# Patient Record
Sex: Male | Born: 1955 | Race: White | Hispanic: No | Marital: Married | Smoking: Former smoker
Health system: Southern US, Community
[De-identification: ages and names within clinical notes are randomized; demographics above are authoritative.]

## PROBLEM LIST (undated history)

## (undated) DIAGNOSIS — K219 Gastro-esophageal reflux disease without esophagitis: Secondary | ICD-10-CM

## (undated) DIAGNOSIS — I10 Essential (primary) hypertension: Secondary | ICD-10-CM

## (undated) DIAGNOSIS — I447 Left bundle-branch block, unspecified: Secondary | ICD-10-CM

## (undated) HISTORY — DX: Essential (primary) hypertension: I10

## (undated) HISTORY — PX: SHOULDER SURGERY: SHX246

---

## 1996-01-29 HISTORY — PX: CARDIAC CATHETERIZATION: SHX172

## 1998-01-28 HISTORY — PX: KNEE ARTHROSCOPY: SUR90

## 2006-01-28 HISTORY — PX: COLONOSCOPY: SHX174

## 2006-09-02 ENCOUNTER — Encounter: Payer: Self-pay | Admitting: Gastroenterology

## 2006-09-02 ENCOUNTER — Ambulatory Visit: Payer: Self-pay | Admitting: Gastroenterology

## 2010-04-02 ENCOUNTER — Encounter (INDEPENDENT_AMBULATORY_CARE_PROVIDER_SITE_OTHER): Payer: Self-pay | Admitting: *Deleted

## 2010-04-04 ENCOUNTER — Encounter: Payer: Self-pay | Admitting: Gastroenterology

## 2010-04-10 ENCOUNTER — Telehealth: Payer: Self-pay | Admitting: Gastroenterology

## 2010-04-10 NOTE — Letter (Signed)
Summary: Moviprep Instructions  [facility]  [address]   [place], [place] [address]   Phone: [phone]  Fax: [phone]       Ryan Thomas    1955-02-12    MRN: [id number]        Procedure Day Dorna Bloom: Wednesday, 04-18-10     Arrival Time: 1:30 p.m.     Procedure Time: 2:30 p.m.     Location of Procedure:                    x [facility] (4th Floor)                        PREPARATION FOR COLONOSCOPY WITH MOVIPREP   Starting 5 days prior to your procedure 04-13-10 do not eat nuts, seeds, popcorn, corn, beans, peas,  salads, or any raw vegetables.  Do not take any fiber supplements (e.g. Metamucil, Citrucel, and Benefiber).  THE DAY BEFORE YOUR PROCEDURE         DATE: 04-17-10  DAY: Tuesday  1.  Drink clear liquids the entire day-NO SOLID FOOD  2.  Do not drink anything colored red or purple.  Avoid juices with pulp.  No orange juice.  3.  Drink at least 64 oz. (8 glasses) of fluid/clear liquids during the day to prevent dehydration and help the prep work efficiently.  CLEAR LIQUIDS INCLUDE: Water Jello Ice Popsicles Tea (sugar ok, no milk/cream) Powdered fruit flavored drinks Coffee (sugar ok, no milk/cream) Gatorade Juice: apple, white grape, white cranberry  Lemonade Clear bullion, consomm, broth Carbonated beverages (any kind) Strained chicken noodle soup Hard Candy                             4.  In the morning, mix first dose of MoviPrep solution:    Empty 1 Pouch A and 1 Pouch B into the disposable container    Add lukewarm drinking water to the top line of the container. Mix to dissolve    Refrigerate (mixed solution should be used within 24 hrs)  5.  Begin drinking the prep at 5:00 p.m. The MoviPrep container is divided by 4 marks.   Every 15 minutes drink the solution down to the next mark (approximately 8 oz) until the full liter is complete.   6.  Follow completed prep with 16 oz of clear liquid of your choice  (Nothing red or purple).  Continue to drink clear liquids until bedtime.  7.  Before going to bed, mix second dose of MoviPrep solution:    Empty 1 Pouch A and 1 Pouch B into the disposable container    Add lukewarm drinking water to the top line of the container. Mix to dissolve    Refrigerate  THE DAY OF YOUR PROCEDURE      DATE: 04-18-10 DAY: Wednesday  Beginning at 9:30 a.m. (5 hours before procedure):         1. Every 15 minutes, drink the solution down to the next mark (approx 8 oz) until the full liter is complete.  2. Follow completed prep with 16 oz. of clear liquid of your choice.    3. You may drink clear liquids until 12:30 p.m. (2 HOURS BEFORE PROCEDURE).   MEDICATION INSTRUCTIONS  Unless otherwise instructed, you should take regular prescription medications with a small sip of water   as early as possible the morning of your procedure.  OTHER INSTRUCTIONS  You will need a responsible adult at least 56 years of age to accompany you and drive you home.   This person must remain in the waiting room during your procedure.  Wear loose fitting clothing that is easily removed.  Leave jewelry and other valuables at home.  However, you may wish to bring a book to read or  an iPod/MP3 player to listen to music as you wait for your procedure to start.  Remove all body piercing jewelry and leave at home.  Total time from sign-in until discharge is approximately 2-3 hours.  You should go home directly after your procedure and rest.  You can resume normal activities the  day after your procedure.  The day of your procedure you should not:   Drive   Make legal decisions   Operate machinery   Drink alcohol   Return to work  You will receive specific instructions about eating, activities and medications before you leave.    The above instructions have been reviewed and explained to me by   Wyona Almas RN  April 04, 2010 9:21 AM     I fully  understand and can verbalize these instructions _____________________________ Date _________

## 2010-04-10 NOTE — Letter (Signed)
Summary: Pre Visit Letter Revised  [facility]  [address] [place], [place] [address]   Phone: [phone]  Fax: [phone]        04/02/2010 MRN: [id number] Ryan Thomas [address] [place], [place]  [address]             Procedure Date: 04/18/2010 @ 2:30   Direct colon-Dr. Jarold Motto   Welcome to the Gastroenterology Division at [facility].    You are scheduled to see a nurse for your pre-procedure visit on 04/04/2010 at 9:00 on the 3rd floor at [facility], [address]  We ask that you try to arrive at our office 15 minutes prior to your appointment time to allow for check-in.  Please take a minute to review the attached form.  If you answer "Yes" to one or more of the questions on the first page, we ask that you call the person listed at your earliest opportunity.  If you answer "No" to all of the questions, please complete the rest of the form and bring it to your appointment.    Your nurse visit will consist of discussing your medical and surgical history, your immediate family medical history, and your medications.   If you are unable to list all of your medications on the form, please bring the medication bottles to your appointment and we will list them.  We will need to be aware of both prescribed and over the counter drugs.  We will need to know exact dosage information as well.    Please be prepared to read and sign documents such as consent forms, a financial agreement, and acknowledgement forms.  If necessary, and with your consent, a friend or relative is welcome to sit-in on the nurse visit with you.  Please bring your insurance card so that we may make a copy of it.  If your insurance requires a referral to see a specialist, please bring your referral form from your primary care physician.  No co-pay is required for this nurse visit.     If you cannot keep your appointment, please call [phone] to cancel or reschedule prior to your  appointment date.  This allows [place] the opportunity to schedule an appointment for another patient in need of care.    Thank you for choosing [facility] for your medical needs.  We appreciate the opportunity to care for you.  Please visit [place] at our website  to learn more about our practice.  Sincerely, The Gastroenterology Division

## 2010-04-10 NOTE — Miscellaneous (Signed)
Summary: [facility] Previsit/prep  Clinical Lists Changes  Medications: Added new medication of MOVIPREP 100 GM  SOLR (PEG-KCL-NACL-NASULF-NA ASC-C) As per prep instructions. - Signed Rx of MOVIPREP 100 GM  SOLR (PEG-KCL-NACL-NASULF-NA ASC-C) As per prep instructions.;  #1 x 0;  Signed;  Entered by: Wyona Almas RN;  Authorized by: Mardella Layman MD [facility];  Method used: Electronically to [facility] [address], [place], [place], [place]  [address], Ph: [phone], Fax: [phone] Observations: Added new observation of NKA: T (04/04/2010 8:41)    Prescriptions: MOVIPREP 100 GM  SOLR (PEG-KCL-NACL-NASULF-NA ASC-C) As per prep instructions.  #1 x 0   Entered by:   Wyona Almas RN   Authorized by:   Mardella Layman MD [facility]   Signed by:   Wyona Almas RN on 04/04/2010   Method used:   Electronically to        [facility]  [address] (retail)       [address]       [place], [place]  [address]       Ph: [phone]       Fax: [phone]   RxID:   [id number]

## 2010-04-17 NOTE — Procedures (Signed)
Summary: Laural Roes MD  Laural Roes MD   Imported By: Lester [place] 04/09/2010 08:42:21  _____________________________________________________________________  External Attachment:    Type:   Image     Comment:   External Document

## 2010-04-18 ENCOUNTER — Other Ambulatory Visit: Payer: Self-pay | Admitting: Gastroenterology

## 2010-04-26 NOTE — Progress Notes (Signed)
Summary: Questions  Phone Note Call from Patient Call back at [phone]   Caller: Patient Call For: Dr. Jarold Motto Reason for Call: Talk to Nurse Summary of Call: Patient would like to speak with Dr. Maris Berger nurse about whether or not Dr. Jarold Motto thinks that he should have a colon at this time, his insurnace ([employer]) will not pay for a colon now because it has only been 3 years since his last but he wants the doctors opinion Initial call taken by: [place] Johnson,  April 10, 2010 11:48 AM  Follow-up for Phone Call        insurance will not pay for colonoscopy until 5 years which will be 2013, no exceptions. Do you want pt to have colonoscopy anyway. Follow-up by: Harlow Mares CMA Duncan Dull),  April 10, 2010 11:59 AM  Additional Follow-up for Phone Call Additional follow up Details #1::        5y is fine Additional Follow-up by: Mardella Layman MD FACG,  April 16, 2010 8:36 AM    Additional Follow-up for Phone Call Additional follow up Details #2::    pt advied and procedure cx. recall will be entered into Epic.

## 2011-09-20 ENCOUNTER — Encounter: Payer: Self-pay | Admitting: Gastroenterology

## 2011-09-27 ENCOUNTER — Ambulatory Visit (AMBULATORY_SURGERY_CENTER): Payer: BC Managed Care – PPO | Admitting: *Deleted

## 2011-09-27 ENCOUNTER — Encounter: Payer: Self-pay | Admitting: Gastroenterology

## 2011-09-27 VITALS — Ht 73.0 in | Wt 254.1 lb

## 2011-09-27 DIAGNOSIS — Z1211 Encounter for screening for malignant neoplasm of colon: Secondary | ICD-10-CM

## 2011-09-27 MED ORDER — PEG-KCL-NACL-NASULF-NA ASC-C 100 G PO SOLR: ORAL | Status: DC

## 2011-09-27 NOTE — Progress Notes (Signed)
No allergies to soy or products.  Pt brought his records from his 2008 with him.  Pt told not to take his Lisinopril-HCTZ the day of his procedure before coming in; taken when you get home that day.  Understanding voiced

## 2011-10-14 ENCOUNTER — Encounter: Payer: PRIVATE HEALTH INSURANCE | Admitting: Gastroenterology

## 2011-10-21 ENCOUNTER — Telehealth: Payer: Self-pay | Admitting: Gastroenterology

## 2011-10-21 ENCOUNTER — Encounter: Payer: Self-pay | Admitting: Gastroenterology

## 2011-10-21 NOTE — Telephone Encounter (Signed)
no

## 2011-10-23 ENCOUNTER — Encounter: Payer: BC Managed Care – PPO | Admitting: Gastroenterology

## 2012-01-29 HISTORY — PX: BLEPHAROPLASTY: SUR158

## 2012-01-29 HISTORY — PX: CATARACT EXTRACTION W/ INTRAOCULAR LENS  IMPLANT, BILATERAL: SHX1307

## 2012-10-06 ENCOUNTER — Ambulatory Visit: Payer: Self-pay | Admitting: Otolaryngology

## 2012-10-15 ENCOUNTER — Ambulatory Visit: Payer: Self-pay | Admitting: Otolaryngology

## 2012-11-16 ENCOUNTER — Ambulatory Visit: Payer: Self-pay | Admitting: Ophthalmology

## 2012-11-16 LAB — POTASSIUM: Potassium: 4 mmol/L (ref 3.5–5.1)

## 2012-11-24 ENCOUNTER — Ambulatory Visit: Payer: Self-pay | Admitting: Ophthalmology

## 2012-12-08 ENCOUNTER — Ambulatory Visit: Payer: Self-pay | Admitting: Ophthalmology

## 2012-12-08 LAB — POTASSIUM: Potassium: 4 mmol/L (ref 3.5–5.1)

## 2012-12-15 ENCOUNTER — Ambulatory Visit: Payer: Self-pay | Admitting: Ophthalmology

## 2013-01-19 ENCOUNTER — Ambulatory Visit: Payer: Self-pay | Admitting: Family Medicine

## 2013-11-05 ENCOUNTER — Telehealth: Payer: Self-pay | Admitting: Internal Medicine

## 2013-11-11 ENCOUNTER — Encounter: Payer: Self-pay | Admitting: Internal Medicine

## 2013-11-11 NOTE — Telephone Encounter (Signed)
Pt scheduled for appt.

## 2013-12-30 ENCOUNTER — Ambulatory Visit (AMBULATORY_SURGERY_CENTER): Payer: Self-pay | Admitting: *Deleted

## 2013-12-30 VITALS — Ht 73.0 in | Wt 273.0 lb

## 2013-12-30 DIAGNOSIS — Z86010 Personal history of colonic polyps: Secondary | ICD-10-CM

## 2013-12-30 MED ORDER — MOVIPREP 100 G PO SOLR: ORAL | Status: DC

## 2013-12-30 NOTE — Progress Notes (Signed)
No allergies to eggs or soy. No problems with anesthesia.  Pt given Emmi instructions for colonoscopy  No oxygen use  No diet drug use  

## 2014-01-12 ENCOUNTER — Ambulatory Visit (AMBULATORY_SURGERY_CENTER): Payer: BC Managed Care – PPO | Admitting: Internal Medicine

## 2014-01-12 ENCOUNTER — Encounter: Payer: Self-pay | Admitting: Internal Medicine

## 2014-01-12 VITALS — BP 134/81 | HR 61 | Temp 96.1°F | Resp 25 | Ht 73.0 in | Wt 273.0 lb

## 2014-01-12 DIAGNOSIS — D125 Benign neoplasm of sigmoid colon: Secondary | ICD-10-CM

## 2014-01-12 DIAGNOSIS — Z1211 Encounter for screening for malignant neoplasm of colon: Secondary | ICD-10-CM

## 2014-01-12 DIAGNOSIS — K621 Rectal polyp: Secondary | ICD-10-CM

## 2014-01-12 DIAGNOSIS — D123 Benign neoplasm of transverse colon: Secondary | ICD-10-CM

## 2014-01-12 DIAGNOSIS — D128 Benign neoplasm of rectum: Secondary | ICD-10-CM

## 2014-01-12 DIAGNOSIS — Z86010 Personal history of colonic polyps: Secondary | ICD-10-CM

## 2014-01-12 MED ORDER — SODIUM CHLORIDE 0.9 % IV SOLN: 500.0000 mL | INTRAVENOUS | Status: DC

## 2014-01-12 NOTE — Op Note (Signed)
[facility]  [address] [place], [address]   COLONOSCOPY PROCEDURE REPORT  PATIENT: Ryan, Thomas  MR#: [id number] BIRTHDATE: Jul 12, 1955 , 64  yrs. old GENDER: male ENDOSCOPIST: Jerene Bears, MD PROCEDURE DATE:  01/12/2014 PROCEDURE:   Colonoscopy with snare polypectomy First Screening Colonoscopy - Avg.  risk and is 50 yrs.  old or older - No.  Prior Negative Screening - Now for repeat screening. N/A  History of Adenoma - Now for follow-up colonoscopy & has been > or = to 3 yrs.  Yes hx of adenoma.  Has been 3 or more years since last colonoscopy.  Polyps Removed Today? Yes. ASA CLASS:   Class II INDICATIONS:surveillance colonoscopy based on a history of adenomatous colonic polyp(s). MEDICATIONS: Monitored anesthesia care, Propofol 450 mg IV, and lidocaine 40 mg IV  DESCRIPTION OF PROCEDURE:   After the risks benefits and alternatives of the procedure were thoroughly explained, informed consent was obtained.  The digital rectal exam revealed no rectal mass.   The [id number]  endoscope was introduced through the anus and advanced to the cecum, which was identified by both the appendix and ileocecal valve. No adverse events experienced. The quality of the prep was good, using MoviPrep  The instrument was then slowly withdrawn as the colon was fully examined.   COLON FINDINGS: Three sessile polyps ranging between 3-106mm in size were found in the transverse colon (2) and rectum (1). Polypectomies were performed with a cold snare.  The resection was complete, the polyp tissue was completely retrieved and sent to histology.   There was mild diverticulosis noted in the sigmoid colon.  Retroflexed views revealed no abnormalities. The time to cecum=4 minutes 04 seconds.  Withdrawal time=13 minutes 11 seconds. The scope was withdrawn and the procedure completed. COMPLICATIONS: There were no immediate complications.  ENDOSCOPIC IMPRESSION: 1.   Three  sessile polyps ranging between 3-42mm in size were found in the transverse colon and rectum; polypectomies were performed with a cold snare 2.   Mild diverticulosis was noted in the sigmoid colon  RECOMMENDATIONS: 1.  Await pathology results 2.  High fiber diet 3.  Timing of repeat colonoscopy will be determined by pathology findings. 4.  You will receive a letter within 1-2 weeks with the results of your biopsy as well as final recommendations.  Please call my office if you have not received a letter after 3 weeks.  eSigned:  Jerene Bears, MD 01/12/2014 8:31 AM   cc: [place] Pink, MD and The Patient

## 2014-01-12 NOTE — Progress Notes (Signed)
Stable to RR 

## 2014-01-12 NOTE — Patient Instructions (Signed)
Colon polyps removed today, diverticulosis seen also. Handouts given on diverticulosis, polyps and high fiber diet.  Resume current medications. Call us with any questions or concerns. Thank you!  YOU HAD AN ENDOSCOPIC PROCEDURE TODAY AT [facility]: Refer to the procedure report that was given to you for any specific questions about what was found during the examination.  If the procedure report does not answer your questions, please call your gastroenterologist to clarify.  If you requested that your care partner not be given the details of your procedure findings, then the procedure report has been included in a sealed envelope for you to review at your convenience later.  YOU SHOULD EXPECT: Some feelings of bloating in the abdomen. Passage of more gas than usual.  Walking can help get rid of the air that was put into your GI tract during the procedure and reduce the bloating. If you had a lower endoscopy (such as a colonoscopy or flexible sigmoidoscopy) you may notice spotting of blood in your stool or on the toilet paper. If you underwent a bowel prep for your procedure, then you may not have a normal bowel movement for a few days.  DIET: Your first meal following the procedure should be a light meal and then it is ok to progress to your normal diet.  A half-sandwich or bowl of soup is an example of a good first meal.  Heavy or fried foods are harder to digest and may make you feel nauseous or bloated.  Likewise meals heavy in dairy and vegetables can cause extra gas to form and this can also increase the bloating.  Drink plenty of fluids but you should avoid alcoholic beverages for 24 hours.  ACTIVITY: Your care partner should take you home directly after the procedure.  You should plan to take it easy, moving slowly for the rest of the day.  You can resume normal activity the day after the procedure however you should NOT DRIVE or use heavy machinery for 24 hours (because of the  sedation medicines used during the test).    SYMPTOMS TO REPORT IMMEDIATELY: A gastroenterologist can be reached at any hour.  During normal business hours, 8:30 AM to 5:00 PM Monday through Friday, call [phone].  After hours and on weekends, please call the GI answering service at [phone] who will take a message and have the physician on call contact you.   Following lower endoscopy (colonoscopy or flexible sigmoidoscopy):  Excessive amounts of blood in the stool  Significant tenderness or worsening of abdominal pains  Swelling of the abdomen that is new, acute  Fever of 100F or higher  Following upper endoscopy (EGD)  Vomiting of blood or coffee ground material  New chest pain or pain under the shoulder blades  Painful or persistently difficult swallowing  New shortness of breath  Fever of 100F or higher  Black, tarry-looking stools  FOLLOW UP: If any biopsies were taken you will be contacted by phone or by letter within the next 1-3 weeks.  Call your gastroenterologist if you have not heard about the biopsies in 3 weeks.  Our staff will call the home number listed on your records the next business day following your procedure to check on you and address any questions or concerns that you may have at that time regarding the information given to you following your procedure. This is a courtesy call and so if there is no answer at the home number and we have  not heard from you through the emergency physician on call, we will assume that you have returned to your regular daily activities without incident.  SIGNATURES/CONFIDENTIALITY: You and/or your care partner have signed paperwork which will be entered into your electronic medical record.  These signatures attest to the fact that that the information above on your After Visit Summary has been reviewed and is understood.  Full responsibility of the confidentiality of this discharge information lies with you and/or your  care-partner.

## 2014-01-12 NOTE — Progress Notes (Signed)
Called to room to assist during endoscopic procedure.  Patient ID and intended procedure confirmed with present staff. Received instructions for my participation in the procedure from the performing physician.  

## 2014-01-13 ENCOUNTER — Telehealth: Payer: Self-pay | Admitting: *Deleted

## 2014-01-13 NOTE — Telephone Encounter (Signed)
  Follow up Call-  Call back number 01/12/2014  Post procedure Call Back phone  # [phone]  Permission to leave phone message Yes   [facility]

## 2014-01-18 ENCOUNTER — Encounter: Payer: Self-pay | Admitting: Internal Medicine

## 2014-02-25 ENCOUNTER — Ambulatory Visit: Payer: Self-pay | Admitting: Orthopaedic Surgery

## 2014-04-10 ENCOUNTER — Emergency Department: Payer: Self-pay | Admitting: Emergency Medicine

## 2014-05-20 NOTE — Op Note (Signed)
PATIENT NAME:  Ryan Thomas, Ryan Thomas MR#:  [id number] DATE OF BIRTH:  25-Oct-1955  DATE OF PROCEDURE:  11/24/2012  LOCATION: [facility]  PREOPERATIVE DIAGNOSIS: Visually significant cataract of the right eye.   POSTOPERATIVE DIAGNOSIS: Visually significant cataract of the right eye.   OPERATIVE PROCEDURE: Cataract extraction by phacoemulsification with implant of intraocular lens to right eye.   SURGEON: Birder Robson, MD  ANESTHESIA:  1. Managed anesthesia care.  2. Topical tetracaine drops followed by 2% Xylocaine jelly applied in the preoperative holding area.   COMPLICATIONS: None.   TECHNIQUE:  Stop and chop.  DESCRIPTION OF PROCEDURE: The patient was examined and consented in the preoperative holding area where the aforementioned topical anesthesia was applied to the right eye and then brought back to the Operating Room where the right eye was prepped and draped in the usual sterile ophthalmic fashion and a lid speculum was placed. A paracentesis was created with the side port blade and the anterior chamber was filled with viscoelastic. A near clear corneal incision was performed with the steel keratome. A continuous curvilinear capsulorrhexis was performed with a cystotome followed by the capsulorrhexis forceps. Hydrodissection and hydrodelineation were carried out with BSS on a blunt cannula. The lens was removed in a stop and chop technique and the remaining cortical material was removed with the irrigation-aspiration handpiece. The capsular bag was inflated with viscoelastic and the Alcon [id number] 18.0-diopter lens, serial number [id number] was placed in the capsular bag without complication. The remaining viscoelastic was removed from the eye with the irrigation-aspiration handpiece. The wounds were hydrated. The anterior chamber was flushed with Miostat and the eye was inflated to physiologic pressure. 0.1 mL of cefuroxime concentration 10 mg/mL was placed in the  anterior chamber. The wounds were found to be water tight. The eye was dressed with Vigamox. The patient was given protective glasses to wear throughout the day and a shield with which to sleep tonight. The patient was also given drops with which to begin a drop regimen today and will follow-up with me in one day.     ____________________________ Livingston Diones. Shanetta Nicolls, MD wlp:mr D: 11/24/2012 22:02:54 ET T: 11/24/2012 19:21:13 ET JOB#: [id number]  cc: Eshawn Coor L. Baylen Buckner, MD, <Dictator> Livingston Diones Trygve Thal MD ELECTRONICALLY SIGNED 11/30/2012 10:30

## 2014-05-20 NOTE — Op Note (Signed)
PATIENT NAME:  Ryan Thomas, Ryan Thomas MR#:  [id number] DATE OF BIRTH:  [birth year]  DATE OF PROCEDURE:  12/15/2012  PREOPERATIVE DIAGNOSIS: Visually significant cataract of the left eye.   POSTOPERATIVE DIAGNOSIS: Visually significant cataract of the left eye.   OPERATIVE PROCEDURE: Cataract extraction by phacoemulsification with implant of intraocular lens to the left eye.   SURGEON: Birder Robson, MD.   ANESTHESIA:  1. Managed anesthesia care.  2. Topical tetracaine drops followed by 2% Xylocaine jelly applied in the preoperative holding area.   COMPLICATIONS: None.   TECHNIQUE:  Stop and chop.   DESCRIPTION OF PROCEDURE: The patient was examined and consented in the preoperative holding area where the aforementioned topical anesthesia was applied to the left eye and then brought back to the Operating Room where the left eye was prepped and draped in the usual sterile ophthalmic fashion and a lid speculum was placed. A paracentesis was created with the side port blade and the anterior chamber was filled with viscoelastic. A near clear corneal incision was performed with the steel keratome. A continuous curvilinear capsulorrhexis was performed with a cystotome followed by the capsulorrhexis forceps. Hydrodissection and hydrodelineation were carried out with BSS on a blunt cannula. The lens was removed in a stop and chop technique and the remaining cortical material was removed with the irrigation-aspiration handpiece. The capsular bag was inflated with viscoelastic and the Alcon Restore [id number] 18.0-diopter lens, serial number [id number] was placed in the capsular bag without complication. The remaining viscoelastic was removed from the eye with the irrigation-aspiration handpiece. The wounds were hydrated. The anterior chamber was flushed with Miostat and the eye was inflated to physiologic pressure. 0.1 mL of cefuroxime concentration 10 mg/mL was placed in the anterior chamber. The wounds were found  to be water tight. The eye was dressed with Vigamox. The patient was given protective glasses to wear throughout the day and a shield with which to sleep tonight. The patient was also given drops with which to begin a drop regimen today and will follow-up with me in one day.   ____________________________ Livingston Diones. Hema Lanza, MD wlp:gb D: 12/15/2012 20:45:37 ET T: 12/15/2012 21:30:55 ET JOB#: [id number]  cc: Anner Baity L. Shaquoya Cosper, MD, <Dictator> Livingston Diones Broly Hatfield MD ELECTRONICALLY SIGNED 12/16/2012 13:28

## 2014-05-20 NOTE — Op Note (Signed)
PATIENT NAME:  Ryan Thomas, Ryan Thomas MR#:  [id number] DATE OF BIRTH:  Jun 25, 1955  DATE OF PROCEDURE:  10/15/2012  PREOPERATIVE DIAGNOSIS: Bilateral upper eyelid blepharoptosis.   POSTOPERATIVE DIAGNOSIS: Bilateral upper eyelid blepharoptosis.   PROCEDRUE: Bilateral ptosis repair (Fasanella-Servat technique).   SURGEON: Nadeen Landau, M.D.   DESCRIPTION OF THE PROCEURE: The patient was placed in the supine position on the operating room table after general LMA anesthesia had been induced.  The patient was turned 90 degrees counterclockwise from anesthesia. The bilateral eyes were anesthetized with topical proparacaine. The eyelid was inverted over a Desmarres retractor and local anesthesia was injected. The cornea was protected with the corneal shield.  A Putterman clamp was used to clamp the conjunctiva and Mueller's muscle.  A double-arm 5-0 fast absorbing gut on an S14 needle was used to throw one line of sutures. The Mueller's muscle and conjunctiva was resected with a 15 blade in the Putterman clamp.  The second row of sutures was then placed. The knot was tied laterally. The corneal shield was removed. The eye was irrigated with BSS.  The patient was returned to anesthesia and allowed to emerge from anesthesia in the operating room, and taken to the recovery room in stable condition. Iced gauze was placed immediately on the eyes following the procedure.  Total amount of local used was 1.5 mL.  Total amount resected was  8 to 9 mm on the right and 8 mm on the left.  ADDENDUM: Due to creation of excess upper eyelid skin with the ptosis repair, conservative excision in the supratarsal crease externally was carried out and closed with 7-0 nylon on each side. No complications. Estimated blood loss 5 mL.   ____________________________ J. Nadeen Landau, MD jmc:aw D: 10/15/2012 10:19:41 ET T: 10/15/2012 10:28:44 ET JOB#: [id number]  cc: Janalee Dane, MD, <Dictator> Nicholos Johns MD ELECTRONICALLY  SIGNED 10/23/2012 9:59

## 2015-07-02 ENCOUNTER — Emergency Department (HOSPITAL_COMMUNITY)
Admission: EM | Admit: 2015-07-02 | Discharge: 2015-07-02 | Disposition: A | Discharge status: Home / Self Care | Payer: 59 | Attending: Emergency Medicine | Admitting: Emergency Medicine

## 2015-07-02 ENCOUNTER — Encounter (HOSPITAL_COMMUNITY): Payer: Self-pay

## 2015-07-02 DIAGNOSIS — F17210 Nicotine dependence, cigarettes, uncomplicated: Secondary | ICD-10-CM | POA: Insufficient documentation

## 2015-07-02 DIAGNOSIS — R079 Chest pain, unspecified: Secondary | ICD-10-CM | POA: Insufficient documentation

## 2015-07-02 DIAGNOSIS — R5383 Other fatigue: Secondary | ICD-10-CM | POA: Insufficient documentation

## 2015-07-02 DIAGNOSIS — Z79899 Other long term (current) drug therapy: Secondary | ICD-10-CM | POA: Diagnosis not present

## 2015-07-02 DIAGNOSIS — R252 Cramp and spasm: Secondary | ICD-10-CM | POA: Diagnosis present

## 2015-07-02 DIAGNOSIS — I10 Essential (primary) hypertension: Secondary | ICD-10-CM | POA: Diagnosis not present

## 2015-07-02 DIAGNOSIS — E860 Dehydration: Secondary | ICD-10-CM | POA: Insufficient documentation

## 2015-07-02 LAB — BASIC METABOLIC PANEL
ANION GAP: 8 (ref 5–15)
BUN: 12 mg/dL (ref 6–20)
CALCIUM: 9.3 mg/dL (ref 8.9–10.3)
CHLORIDE: 99 mmol/L — AB (ref 101–111)
CO2: 28 mmol/L (ref 22–32)
CREATININE: 1.25 mg/dL — AB (ref 0.61–1.24)
GLUCOSE: 114 mg/dL — AB (ref 65–99)
POTASSIUM: 3.8 mmol/L (ref 3.5–5.1)
Sodium: 135 mmol/L (ref 135–145)

## 2015-07-02 LAB — I-STAT TROPONIN, ED
TROPONIN I, POC: 0 ng/mL (ref 0.00–0.08)
TROPONIN I, POC: 0 ng/mL (ref 0.00–0.08)

## 2015-07-02 LAB — URINALYSIS, ROUTINE W REFLEX MICROSCOPIC
BILIRUBIN URINE: NEGATIVE
Glucose, UA: NEGATIVE mg/dL
Hgb urine dipstick: NEGATIVE
KETONES UR: NEGATIVE mg/dL
Leukocytes, UA: NEGATIVE
NITRITE: NEGATIVE
PH: 6 (ref 5.0–8.0)
Protein, ur: NEGATIVE mg/dL
Specific Gravity, Urine: 1.006 (ref 1.005–1.030)

## 2015-07-02 LAB — CBC
HCT: 43.2 % (ref 39.0–52.0)
Hemoglobin: 13.9 g/dL (ref 13.0–17.0)
MCH: 27.7 pg (ref 26.0–34.0)
MCHC: 32.2 g/dL (ref 30.0–36.0)
MCV: 86.2 fL (ref 78.0–100.0)
PLATELETS: 258 10*3/uL (ref 150–400)
RBC: 5.01 MIL/uL (ref 4.22–5.81)
RDW: 13.7 % (ref 11.5–15.5)
WBC: 8 10*3/uL (ref 4.0–10.5)

## 2015-07-02 MED ORDER — SODIUM CHLORIDE 0.9 % IV BOLUS (SEPSIS)
1000.0000 mL | Freq: Once | INTRAVENOUS | Status: AC
  Administered 2015-07-02: 1000 mL via INTRAVENOUS

## 2015-07-02 NOTE — ED Notes (Signed)
Pt agitated about the wait. Trying to be calm.

## 2015-07-02 NOTE — ED Provider Notes (Signed)
CSN: [id number]     Arrival date & time 07/02/15  1841 History   First MD Initiated Contact with Patient 07/02/15 2124     Chief Complaint  Patient presents with  . possible dehydration   . cramping      (Consider location/radiation/quality/duration/timing/severity/associated sxs/prior Treatment) HPI Comments: 60 year old male with no significant medical history, current smoker presents with leg and body cramping including the hands. Patient has been playing golf outdoors in the heat the past 3 days and not able to keep up with fluids. Patient did have very brief chest ache along with this that subsided. One episode nonradiating. No recent chest pain of exertion. No cardiac history.  The history is provided by the patient.    Past Medical History  Diagnosis Date  . Hypertension    Past Surgical History  Procedure Laterality Date  . Colonoscopy  2008  . Cardiac catheterization  1998    normal  . Knee arthroscopy Right 2000  . Cataract extraction w/ intraocular lens  implant, bilateral  2014  . Blepharoplasty Bilateral 2014   Family History  Problem Relation Age of Onset  . Stomach cancer Father   . Colon cancer Neg Hx   . Esophageal cancer Neg Hx   . Rectal cancer Neg Hx    Social History  Substance Use Topics  . Smoking status: Current Every Day Smoker    Types: Cigarettes  . Smokeless tobacco: Never Used     Comment: e cigarettes  . Alcohol Use: 2.4 oz/week    0 Standard drinks or equivalent, 4 Cans of beer per week    Review of Systems  Constitutional: Positive for fatigue. Negative for fever and chills.  HENT: Negative for congestion.   Eyes: Negative for visual disturbance.  Respiratory: Negative for shortness of breath.   Cardiovascular: Positive for chest pain.  Gastrointestinal: Negative for vomiting and abdominal pain.  Genitourinary: Negative for dysuria and flank pain.  Musculoskeletal: Positive for arthralgias. Negative for back pain, neck pain and neck  stiffness.  Skin: Negative for rash.  Neurological: Negative for light-headedness and headaches.      Allergies  Review of patient's allergies indicates no known allergies.  Home Medications   Prior to Admission medications   Medication Sig Start Date End Date Taking? Authorizing Provider  lisinopril-hydrochlorothiazide (PRINZIDE,ZESTORETIC) 10-12.5 MG per tablet Take 1 tablet by mouth daily.    Historical Provider, MD   BP 145/80 mmHg  Pulse 69  Temp(Src) 99 F (37.2 C) (Oral)  Resp 24  Ht 6' (1.829 m)  Wt 270 lb (122.471 kg)  BMI 36.61 kg/m2  SpO2 98% Physical Exam  Constitutional: He is oriented to person, place, and time. He appears well-developed and well-nourished.  HENT:  Head: Normocephalic and atraumatic.  Dry mucous membranes  Eyes: Conjunctivae are normal. Right eye exhibits no discharge. Left eye exhibits no discharge.  Neck: Normal range of motion. Neck supple. No tracheal deviation present.  Cardiovascular: Normal rate and regular rhythm.   Pulmonary/Chest: Effort normal and breath sounds normal.  Abdominal: Soft. He exhibits no distension. There is no tenderness. There is no guarding.  Musculoskeletal: He exhibits no edema.  Neurological: He is alert and oriented to person, place, and time.  Skin: Skin is warm. No rash noted.  Psychiatric: He has a normal mood and affect.  Nursing note and vitals reviewed.   ED Course  Procedures (including critical care time) Labs Review Labs Reviewed  BASIC METABOLIC PANEL - Abnormal; Notable for the  following:    Chloride 99 (*)    Glucose, Bld 114 (*)    Creatinine, Ser 1.25 (*)    All other components within normal limits  CBC  URINALYSIS, ROUTINE W REFLEX MICROSCOPIC (NOT AT [facility])  I-STAT TROPOININ, ED  I-STAT TROPOININ, ED    Imaging Review No results found. I have personally reviewed and evaluated these images and lab results as part of my medical decision-making.   EKG Interpretation   Date/Time:   Sunday July 02 2015 19:10:59 EDT Ventricular Rate:  78 PR Interval:  164 QRS Duration: 152 QT Interval:  436 QTC Calculation: 497 R Axis:   83 Text Interpretation:  Normal sinus rhythm Left bundle branch block  Abnormal ECG Similar previous Confirmed by Zailee Vallely MD, Malissie Musgrave [phone]) on  07/02/2015 7:15:28 PM      MDM   Final diagnoses:  Cramp of both lower extremities  Dehydration  Patient presented with clinical concern for dehydration with mild cramping and heat exposure the past to 3 days. Plan for IV fluid bolus and cardiac screen. Patient had very brief atypical chest pain along with his body cramps. Patient is low risk discussed delta troponin and outpatient follow-up for stress test later this week. No CP in ED.   Results and differential diagnosis were discussed with the patient/parent/guardian. Xrays were independently reviewed by myself.  Close follow up outpatient was discussed, comfortable with the plan.   Medications  sodium chloride 0.9 % bolus 1,000 mL (1,000 mLs Intravenous New Bag/Given 07/02/15 2241)  sodium chloride 0.9 % bolus 1,000 mL (0 mLs Intravenous Stopped 07/02/15 2241)    Filed Vitals:   07/02/15 2115 07/02/15 2145 07/02/15 2215 07/02/15 2230  BP: 140/76 135/78 139/83 145/80  Pulse: 69 65 68 69  Temp:      TempSrc:      Resp: 24 23 21 24   Height:      Weight:      SpO2: 99% 98% 100% 98%    Final diagnoses:  Cramp of both lower extremities        Elnora Morrison, MD 07/02/15 2318

## 2015-07-02 NOTE — ED Notes (Addendum)
Pt comes to tech first and states he's not staying any longer. He wants to he discharged. This tech did not take pt otf, his wife stayed in the lobby and ask for pt to not be discharged. When a room came open wife called pt on cell phone and pt came back inside. Pt was taken by wheel chair by tech Josh to B19.

## 2015-07-02 NOTE — Discharge Instructions (Signed)
Discuss outpatient stress test with your doctor. Stay well hydrated with water If you were given medicines take as directed.  If you are on coumadin or contraceptives realize their levels and effectiveness is altered by many different medicines.  If you have any reaction (rash, tongues swelling, other) to the medicines stop taking and see a physician.    If your blood pressure was elevated in the ER make sure you follow up for management with a primary doctor or return for chest pain, shortness of breath or stroke symptoms.  Please follow up as directed and return to the ER or see a physician for new or worsening symptoms.  Thank you. Filed Vitals:   07/02/15 2115 07/02/15 2145 07/02/15 2215 07/02/15 2230  BP: 140/76 135/78 139/83 145/80  Pulse: 69 65 68 69  Temp:      TempSrc:      Resp: 24 23 21 24   Height:      Weight:      SpO2: 99% 98% 100% 98%

## 2015-07-02 NOTE — ED Notes (Signed)
Patient here with arm/leg cramping x 2 days after playing multiple rounds of golf the past 3 days. Decreased intake of fluids the past 3 days for same. Had some abdominal cramping, and chest discomfort

## 2015-09-29 ENCOUNTER — Encounter
Admission: RE | Admit: 2015-09-29 | Discharge: 2015-09-29 | Disposition: A | Discharge status: Home / Self Care | Payer: 59 | Source: Ambulatory Visit | Attending: Surgery | Admitting: Surgery

## 2015-09-29 HISTORY — DX: Left bundle-branch block, unspecified: I44.7

## 2015-09-29 HISTORY — DX: Gastro-esophageal reflux disease without esophagitis: K21.9

## 2015-09-29 NOTE — Patient Instructions (Signed)
  Your procedure is scheduled on: 10-10-15 (TUESDAY) Report to Same Day Surgery 2nd floor medical mall To find out your arrival time please call [phone] between 1PM - 3PM on 10-09-15 [facility])  Remember: Instructions that are not followed completely may result in serious medical risk, up to and including death, or upon the discretion of your surgeon and anesthesiologist your surgery may need to be rescheduled.    _x___ 1. Do not eat food or drink liquids after midnight. No gum chewing or hard candies.     __x__ 2. No Alcohol for 24 hours before or after surgery.   __x__3. No Smoking for 24 prior to surgery.   ____  4. Bring all medications with you on the day of surgery if instructed.    __x__ 5. Notify your doctor if there is any change in your medical condition     (cold, fever, infections).     Do not wear jewelry, make-up, hairpins, clips or nail polish.  Do not wear lotions, powders, or perfumes. You may wear deodorant.  Do not shave 48 hours prior to surgery. Men may shave face and neck.  Do not bring valuables to the hospital.    [facility] is not responsible for any belongings or valuables.               Contacts, dentures or bridgework may not be worn into surgery.  Leave your suitcase in the car. After surgery it may be brought to your room.  For patients admitted to the hospital, discharge time is determined by your treatment team.   Patients discharged the day of surgery will not be allowed to drive home.    Please read over the following fact sheets that you were given:   [facility] Preparing for Surgery and or MRSA Information   ____ Take these medicines the morning of surgery with A SIP OF WATER:    1. NONE  2.  3.  4.  5.  6.  ____ Fleet Enema (as directed)   _x___ Use CHG Soap or sage wipes as directed on instruction sheet   ____ Use inhalers on the day of surgery and bring to hospital day of surgery  ____ Stop metformin 2 days prior to  surgery    ____ Take 1/2 of usual insulin dose the night before surgery and none on the morning of  surgery.   ____ Stop aspirin or coumadin, or plavix  _x__ Stop Anti-inflammatories such as Advil, Aleve, Ibuprofen, Motrin, Naproxen,          Naprosyn, Goodies powders or aspirin products. Ok to take Tylenol.   ____ Stop supplements until after surgery.    ____ Bring C-Pap to the hospital.

## 2015-09-29 NOTE — Pre-Procedure Instructions (Signed)
CALLED DR Amie Critchley BACK AND TOLD HIM I HAD AN EKG FROM 02-2014 THAT SHOWED A LBBB AND IT LOOKED LIKE HIS PCP IS AWARE OF THIS.  DR P SAID HE NOW WANTS MEDICAL CLEARANCE-FAXED CLEARANCE INFO OVER TO DR HEDRICKS OFFICE AND WILL CALL ON Tuesday

## 2015-09-29 NOTE — Pre-Procedure Instructions (Signed)
CALLED DR Amie Critchley REGARDING LBBB ON EKG DATING BACK TO ED VISIT ON 07-04-15- NO PREVIOUS EKG FOR COMPARISON AND IT LOOKS AS THOUGH PT NEVER HAD A STRESS TEST THAT THE ED HAD SAID PT NEEDED TO SET UP.  DR PISCITELLO SAID THAT PT NEEDS TO BE SEEN BY A CARDIOLOGIST-CALLED OVER TO DR SMITHS OFFICE AND THEY ARE CLOSED FOR THE DAY. WILL CALL TO THE OFFICE ON Tuesday SINCE ALL OFFICES ARE CLOSED ON Monday FOR LABOR DAY.WENT AHEAD AND FAXED OVER CLEARANCE AND WILL CALL DR SMITHS OFFICE ON TUESDAY

## 2015-10-03 ENCOUNTER — Encounter
Admission: RE | Admit: 2015-10-03 | Discharge: 2015-10-03 | Disposition: A | Discharge status: Home / Self Care | Payer: 59 | Source: Ambulatory Visit | Attending: Surgery | Admitting: Surgery

## 2015-10-03 DIAGNOSIS — Z01812 Encounter for preprocedural laboratory examination: Secondary | ICD-10-CM | POA: Insufficient documentation

## 2015-10-03 LAB — POTASSIUM: Potassium: 3.8 mmol/L (ref 3.5–5.1)

## 2015-10-03 NOTE — Pre-Procedure Instructions (Signed)
SPOKE WITH DR HEDRICK'S NURSE AMY. SHE DID RECEIVE THE CLEARANCE REQUEST THAT I FAXED OVER ON Friday. I INFORMED HER WHY CLEARANCE WAS BEING REQUESTED DUE TO PT COMING TO ED BACK IN June WITH CRAMPING AND CHEST DISCOMFORT AND THE ED MD SAID THAT PT SHOULD F/U OUTPATIENT FOR STRESS TEST. I TOLD AMY THAT HE NEVER F/U-NOT EVEN SURE IF THE ED MD TOLD PT TO DO SO.   AMY SAID SHE WILL TALK TO DR HEDRICK AND WILL LET ME KNOW WHAT IS NEEDED.

## 2015-10-09 NOTE — Pre-Procedure Instructions (Signed)
RECEIVED CLEARANCE NOTE FROM DR HEDRICKS OFFICE-LOW RISK-DR [place] THAT PT HAS HAD LBBB X 20 YEARS AND HAS HAD NO PROBLEM-HAD SHOULDER SURGERY OVER THE LAST COUPLE OF YEARS WITHOUT ANY PROBLEMS- LOW RISK PER DR HEDRICK

## 2015-10-10 ENCOUNTER — Encounter
Admission: RE | Disposition: A | Discharge status: Home / Self Care | Payer: Self-pay | Source: Ambulatory Visit | Attending: Surgery

## 2015-10-10 ENCOUNTER — Ambulatory Visit: Payer: Commercial Managed Care - HMO | Admitting: Anesthesiology

## 2015-10-10 ENCOUNTER — Encounter: Payer: Self-pay | Admitting: *Deleted

## 2015-10-10 ENCOUNTER — Ambulatory Visit
Admission: RE | Admit: 2015-10-10 | Discharge: 2015-10-10 | Disposition: A | Discharge status: Home / Self Care | Payer: Commercial Managed Care - HMO | Source: Ambulatory Visit | Attending: Surgery | Admitting: Surgery

## 2015-10-10 DIAGNOSIS — Z800 Family history of malignant neoplasm of digestive organs: Secondary | ICD-10-CM | POA: Insufficient documentation

## 2015-10-10 DIAGNOSIS — Z801 Family history of malignant neoplasm of trachea, bronchus and lung: Secondary | ICD-10-CM | POA: Insufficient documentation

## 2015-10-10 DIAGNOSIS — K429 Umbilical hernia without obstruction or gangrene: Secondary | ICD-10-CM | POA: Diagnosis present

## 2015-10-10 DIAGNOSIS — K219 Gastro-esophageal reflux disease without esophagitis: Secondary | ICD-10-CM | POA: Diagnosis not present

## 2015-10-10 DIAGNOSIS — Z87891 Personal history of nicotine dependence: Secondary | ICD-10-CM | POA: Insufficient documentation

## 2015-10-10 DIAGNOSIS — Z9849 Cataract extraction status, unspecified eye: Secondary | ICD-10-CM | POA: Diagnosis not present

## 2015-10-10 DIAGNOSIS — Z79899 Other long term (current) drug therapy: Secondary | ICD-10-CM | POA: Diagnosis not present

## 2015-10-10 DIAGNOSIS — Z820 Family history of epilepsy and other diseases of the nervous system: Secondary | ICD-10-CM | POA: Insufficient documentation

## 2015-10-10 DIAGNOSIS — I10 Essential (primary) hypertension: Secondary | ICD-10-CM | POA: Insufficient documentation

## 2015-10-10 DIAGNOSIS — Z8052 Family history of malignant neoplasm of bladder: Secondary | ICD-10-CM | POA: Diagnosis not present

## 2015-10-10 DIAGNOSIS — Z836 Family history of other diseases of the respiratory system: Secondary | ICD-10-CM | POA: Diagnosis not present

## 2015-10-10 HISTORY — PX: UMBILICAL HERNIA REPAIR: SHX196

## 2015-10-10 SURGERY — REPAIR, HERNIA, UMBILICAL, ADULT
Anesthesia: General | Wound class: Clean Contaminated

## 2015-10-10 MED ORDER — LACTATED RINGERS IV SOLN
INTRAVENOUS | Status: DC
  Administered 2015-10-10: 11:00:00 via INTRAVENOUS

## 2015-10-10 MED ORDER — MIDAZOLAM HCL 2 MG/2ML IJ SOLN
INTRAMUSCULAR | Status: DC | PRN
  Administered 2015-10-10: 2 mg via INTRAVENOUS

## 2015-10-10 MED ORDER — FAMOTIDINE 20 MG PO TABS
ORAL_TABLET | ORAL | Status: AC
  Administered 2015-10-10: 20 mg via ORAL
  Filled 2015-10-10: qty 1

## 2015-10-10 MED ORDER — DEXAMETHASONE SODIUM PHOSPHATE 4 MG/ML IJ SOLN
INTRAMUSCULAR | Status: DC | PRN
  Administered 2015-10-10: 5 mg via INTRAVENOUS

## 2015-10-10 MED ORDER — ROCURONIUM BROMIDE 100 MG/10ML IV SOLN
INTRAVENOUS | Status: DC | PRN
  Administered 2015-10-10: 40 mg via INTRAVENOUS

## 2015-10-10 MED ORDER — LIDOCAINE HCL (PF) 4 % IJ SOLN
INTRAMUSCULAR | Status: DC | PRN
  Administered 2015-10-10: 4 mL via RESPIRATORY_TRACT

## 2015-10-10 MED ORDER — HYDROCODONE-ACETAMINOPHEN 5-325 MG PO TABS: 1.0000 | ORAL_TABLET | ORAL | 0 refills | Status: DC | PRN

## 2015-10-10 MED ORDER — BUPIVACAINE-EPINEPHRINE (PF) 0.5% -1:200000 IJ SOLN
INTRAMUSCULAR | Status: AC
  Filled 2015-10-10: qty 30

## 2015-10-10 MED ORDER — LIDOCAINE HCL (CARDIAC) 20 MG/ML IV SOLN
INTRAVENOUS | Status: DC | PRN
  Administered 2015-10-10: 70 mg via INTRAVENOUS

## 2015-10-10 MED ORDER — BUPIVACAINE-EPINEPHRINE 0.5% -1:200000 IJ SOLN
INTRAMUSCULAR | Status: DC | PRN
  Administered 2015-10-10: 8 mL

## 2015-10-10 MED ORDER — NEOSTIGMINE METHYLSULFATE 10 MG/10ML IV SOLN
INTRAVENOUS | Status: DC | PRN
  Administered 2015-10-10: 3 mg via INTRAVENOUS

## 2015-10-10 MED ORDER — FAMOTIDINE 20 MG PO TABS
20.0000 mg | ORAL_TABLET | Freq: Once | ORAL | Status: AC
  Administered 2015-10-10: 20 mg via ORAL

## 2015-10-10 MED ORDER — ONDANSETRON HCL 4 MG/2ML IJ SOLN: 4.0000 mg | Freq: Once | INTRAMUSCULAR | Status: DC | PRN

## 2015-10-10 MED ORDER — FENTANYL CITRATE (PF) 100 MCG/2ML IJ SOLN: 25.0000 ug | INTRAMUSCULAR | Status: DC | PRN

## 2015-10-10 MED ORDER — CEFAZOLIN SODIUM-DEXTROSE 2-4 GM/100ML-% IV SOLN
INTRAVENOUS | Status: AC
  Administered 2015-10-10: 2 g via INTRAVENOUS
  Filled 2015-10-10: qty 100

## 2015-10-10 MED ORDER — LIDOCAINE HCL (PF) 1 % IJ SOLN
INTRAMUSCULAR | Status: AC
  Filled 2015-10-10: qty 2

## 2015-10-10 MED ORDER — FENTANYL CITRATE (PF) 100 MCG/2ML IJ SOLN
INTRAMUSCULAR | Status: DC | PRN
  Administered 2015-10-10: 200 ug via INTRAVENOUS

## 2015-10-10 MED ORDER — SUCCINYLCHOLINE CHLORIDE 20 MG/ML IJ SOLN
INTRAMUSCULAR | Status: DC | PRN
  Administered 2015-10-10: 100 mg via INTRAVENOUS

## 2015-10-10 MED ORDER — PROPOFOL 500 MG/50ML IV EMUL
INTRAVENOUS | Status: DC | PRN
  Administered 2015-10-10: 100 ug/kg/min via INTRAVENOUS

## 2015-10-10 MED ORDER — CEFAZOLIN SODIUM-DEXTROSE 2-4 GM/100ML-% IV SOLN
2.0000 g | Freq: Once | INTRAVENOUS | Status: AC
  Administered 2015-10-10: 2 g via INTRAVENOUS

## 2015-10-10 MED ORDER — GLYCOPYRROLATE 0.2 MG/ML IJ SOLN
INTRAMUSCULAR | Status: DC | PRN
  Administered 2015-10-10: 0.4 mg via INTRAVENOUS

## 2015-10-10 MED ORDER — HYDROCODONE-ACETAMINOPHEN 5-325 MG PO TABS: 1.0000 | ORAL_TABLET | ORAL | Status: DC | PRN

## 2015-10-10 MED ORDER — PROPOFOL 10 MG/ML IV BOLUS
INTRAVENOUS | Status: DC | PRN
  Administered 2015-10-10: 200 mg via INTRAVENOUS

## 2015-10-10 SURGICAL SUPPLY — 24 items
BLADE SURG 15 STRL LF DISP TIS (BLADE) ×1 IMPLANT
BLADE SURG 15 STRL SS (BLADE) ×3
CANISTER SUCT 1200ML W/VALVE (MISCELLANEOUS) ×3 IMPLANT
CHLORAPREP W/TINT 26ML (MISCELLANEOUS) ×3 IMPLANT
DRAPE LAPAROTOMY 77X122 PED (DRAPES) ×3 IMPLANT
ELECT REM PT RETURN 9FT ADLT (ELECTROSURGICAL) ×3
ELECTRODE REM PT RTRN 9FT ADLT (ELECTROSURGICAL) ×1 IMPLANT
GLOVE BIO SURGEON STRL SZ7.5 (GLOVE) ×15 IMPLANT
GOWN STRL REUS W/ TWL LRG LVL3 (GOWN DISPOSABLE) ×3 IMPLANT
GOWN STRL REUS W/TWL LRG LVL3 (GOWN DISPOSABLE) ×9
KIT RM TURNOVER STRD PROC AR (KITS) ×3 IMPLANT
LABEL OR SOLS (LABEL) ×3 IMPLANT
LIQUID BAND (GAUZE/BANDAGES/DRESSINGS) ×3 IMPLANT
MESH SYNTHETIC 4X6 SOFT BARD (Mesh General) ×1 IMPLANT
MESH SYNTHETIC SOFT BARD 4X6 (Mesh General) ×2 IMPLANT
NEEDLE HYPO 25X1 1.5 SAFETY (NEEDLE) ×3 IMPLANT
NS IRRIG 500ML POUR BTL (IV SOLUTION) ×3 IMPLANT
PACK BASIN MINOR ARMC (MISCELLANEOUS) ×3 IMPLANT
SUT CHROMIC 3 0 SH 27 (SUTURE) ×3 IMPLANT
SUT CHROMIC 4 0 RB 1X27 (SUTURE) ×3 IMPLANT
SUT MNCRL+ 5-0 UNDYED PC-3 (SUTURE) ×1 IMPLANT
SUT MONOCRYL 5-0 (SUTURE) ×2
SUT SURGILON 0 30 BLK (SUTURE) ×6 IMPLANT
SYRINGE 10CC LL (SYRINGE) ×3 IMPLANT

## 2015-10-10 NOTE — Transfer of Care (Signed)
Immediate Anesthesia Transfer of Care Note  Patient: Ryan Thomas  Procedure(s) Performed: Procedure(s): HERNIA REPAIR UMBILICAL ADULT with mesh (N/A)  Patient Location: PACU  Anesthesia Type:General  Level of Consciousness: awake, alert , oriented and patient cooperative  Airway & Oxygen Therapy: Patient Spontanous Breathing and Patient connected to nasal cannula oxygen  Post-op Assessment: Report given to RN and Post -op Vital signs reviewed and stable  Post vital signs: Reviewed and stable  Last Vitals:  Vitals:   10/10/15 1054  BP: (!) 147/75  Pulse: 96  Resp: 20  Temp: (!) 35.9 C    Last Pain:  Vitals:   10/10/15 1054  TempSrc: Oral         Complications: No apparent anesthesia complications

## 2015-10-10 NOTE — Discharge Instructions (Addendum)
Take Tylenol or Norco if needed for pain.  Should not drive or do anything dangerous when taking Norco.  May shower and blot dry.  Avoid straining and heavy lifting.   AMBULATORY SURGERY  DISCHARGE INSTRUCTIONS   1) The drugs that you were given will stay in your system until tomorrow so for the next 24 hours you should not:  A) Drive an automobile B) Make any legal decisions C) Drink any alcoholic beverage   2) You may resume regular meals tomorrow.  Today it is better to start with liquids and gradually work up to solid foods.  You may eat anything you prefer, but it is better to start with liquids, then soup and crackers, and gradually work up to solid foods.   3) Please notify your doctor immediately if you have any unusual bleeding, trouble breathing, redness and pain at the surgery site, drainage, fever, or pain not relieved by medication.    Please contact your physician with any problems or Same Day Surgery at [phone], Monday through Friday 6 am to 4 pm, or  at [facility] [place] number at [phone].  Open Hernia Repair, Care After Refer to this sheet in the next few weeks. These instructions provide you with information on caring for yourself after your procedure. Your health care provider may also give you more specific instructions. Your treatment has been planned according to current medical practices, but problems sometimes occur. Call your health care provider if you have any problems or questions after your procedure. WHAT TO EXPECT AFTER THE PROCEDURE After your procedure, it is typical to have the following:  Pain in your abdomen, especially along your incision. You will be given pain medicines to control the pain.  Constipation. You may be given a stool softener to help prevent this. HOME CARE INSTRUCTIONS  Only take over-the-counter or prescription medicines as directed by your health care provider.  Keep the incision area dry and clean. You  may wash the incision area gently with soap and water 48 hours after surgery. Gently blot or dab the incision area dry. Do not take baths, use swimming pools, or use hot tubs for 10 days or until your health care provider approves.  Change bandages (dressings) as directed by your health care provider.  Continue your normal diet as directed by your health care provider. Eat plenty of fruits and vegetables to help prevent constipation.  Drink enough fluids to keep your urine clear or pale yellow. This also helps prevent constipation.  Do not drive until your health care provider says it is okay.  Do not lift anything heavier than 10 lb (4.5 kg) or play contact sports for 4 weeks or until your health care provider approves.  Follow up with your health care provider as directed. Ask your health care provider when to make an appointment to have your stitches (sutures) or staples removed. SEEK MEDICAL CARE IF:  You have increased bleeding coming from the incision site.  You have blood in your stool.  You have increasing pain in the incision area.  You see redness or swelling in the incision area.  You have fluid (pus) coming from the incision.  You have a fever.  You notice a bad smell coming from the incision area or dressing. SEEK IMMEDIATE MEDICAL CARE IF:  You develop a rash.  You have chest pain or shortness of breath.  You feel lightheaded or feel faint.   This information is not intended to replace advice given to  you by your health care provider. Make sure you discuss any questions you have with your health care provider.   Document Released: 08/03/2004 Document Revised: 02/04/2014 Document Reviewed: 08/26/2012 Elsevier Interactive Patient Education [employer].

## 2015-10-10 NOTE — H&P (Signed)
  He reports no change in condition since the day of the office exam.  I discussed the plan for umbilical hernia repair.  Lab work was reviewed.

## 2015-10-10 NOTE — Anesthesia Preprocedure Evaluation (Signed)
Anesthesia Evaluation  Patient identified by MRN, date of birth, ID band Patient awake    Reviewed: Allergy & Precautions, NPO status , Patient's Chart, lab work & pertinent test results  History of Anesthesia Complications Negative for: history of anesthetic complications  Airway Mallampati: II       Dental   Pulmonary neg pulmonary ROS, former smoker,           Cardiovascular hypertension, Pt. on medications      Neuro/Psych negative neurological ROS     GI/Hepatic Neg liver ROS, GERD  ,  Endo/Other  negative endocrine ROS  Renal/GU negative Renal ROS     Musculoskeletal   Abdominal   Peds  Hematology negative hematology ROS (+)   Anesthesia Other Findings   Reproductive/Obstetrics                             Anesthesia Physical Anesthesia Plan  ASA: II  Anesthesia Plan: General   Post-op Pain Management:    Induction: Intravenous  Airway Management Planned: Oral ETT  Additional Equipment:   Intra-op Plan:   Post-operative Plan:   Informed Consent: I have reviewed the patients History and Physical, chart, labs and discussed the procedure including the risks, benefits and alternatives for the proposed anesthesia with the patient or authorized representative who has indicated his/her understanding and acceptance.     Plan Discussed with:   Anesthesia Plan Comments:         Anesthesia Quick Evaluation

## 2015-10-10 NOTE — Anesthesia Procedure Notes (Signed)
Procedure Name: Intubation Date/Time: 10/10/2015 11:19 AM Performed by: Rosaria Ferries, Schneur Crowson Pre-anesthesia Checklist: Patient identified, Emergency Drugs available, Suction available and Patient being monitored Patient Re-evaluated:Patient Re-evaluated prior to inductionOxygen Delivery Method: Circle system utilized Preoxygenation: Pre-oxygenation with 100% oxygen Intubation Type: IV induction Laryngoscope Size: Miller and 3 Grade View: Grade II Tube size: 7.0 mm Number of attempts: 2 (poor view w/ mac blade) Placement Confirmation: ETT inserted through vocal cords under direct vision,  positive ETCO2 and breath sounds checked- equal and bilateral Secured at: 23 cm Tube secured with: Tape Dental Injury: Teeth and Oropharynx as per pre-operative assessment

## 2015-10-10 NOTE — Op Note (Signed)
OPERATIVE REPORT  PREOPERATIVE  DIAGNOSIS: Umbilical hernia  POSTOPERATIVE DIAGNOSIS: Umbilical hernia  PROCEDURE: Umbilical hernia repair  ANESTHESIA: General  SURGEON: Rochel Brome M.D.  INDICATIONS:  He reports a history of bulging at the umbilicus and mild discomfort. An umbilical hernia was demonstrated on physical exam and repair is recommended for definitive treatment.  The patient was placed on the operating table in the supine position under general anesthesia. The abdomen was prepared with ChloraPrep and draped in a sterile manner. A transversely oriented supraumbilical curvilinear incision was made and carried down through subcutaneous tissues. An umbilical hernia sac was dissected free from surrounding tissues and separated from the fascial ring defect. The sac was ligated with 3-0 chromic suture ligature and amputated. The specimen was not submitted for pathology. The stump was allowed to retract. The properitoneal fat was dissected away from the fascial ring defect.  Bard soft mesh was cut to create an oval shape of 1.5 x 2 cm.. It was placed into the properitoneal plane oriented transversely and sutured to the overlying fascia with through and through 0 Surgilon sutures.  The fascial defect was closed with a transversely oriented suture line of interrupted 0 Surgilon figure-of-eight sutures incorporating each suture into the mesh. The skin of the umbilicus was sutured to the subcutaneous tissues with 3-0 chromic suture. The subcutaneous tissues were infiltrated with half percent Sensorcaine with epinephrine. The skin was closed with running 5-0 Monocryl subcuticular suture and LiquiBand.  The patient appeared to be in satisfactory condition and was prepared for transfer to the recovery room  [place].D.

## 2015-10-11 NOTE — Anesthesia Postprocedure Evaluation (Signed)
Anesthesia Post Note  Patient: Ryan Thomas  Procedure(s) Performed: Procedure(s) (LRB): HERNIA REPAIR UMBILICAL ADULT with mesh (N/A)  Patient location during evaluation: PACU Anesthesia Type: General Level of consciousness: awake and alert Pain management: pain level controlled Vital Signs Assessment: post-procedure vital signs reviewed and stable Respiratory status: spontaneous breathing and respiratory function stable Cardiovascular status: stable Anesthetic complications: no    Last Vitals:  Vitals:   10/10/15 1329 10/10/15 1350  BP: 140/83 (!) 146/66  Pulse:  72  Resp: 18 18  Temp:      Last Pain:  Vitals:   10/10/15 1350  TempSrc:   PainSc: 1                  Virtie Bungert K

## 2015-10-15 ENCOUNTER — Encounter: Payer: Self-pay | Admitting: Surgery

## 2018-11-27 ENCOUNTER — Encounter: Payer: Self-pay | Admitting: Internal Medicine

## 2018-12-22 ENCOUNTER — Other Ambulatory Visit: Payer: Self-pay

## 2018-12-22 ENCOUNTER — Ambulatory Visit
Admission: EM | Admit: 2018-12-22 | Discharge: 2018-12-22 | Disposition: A | Discharge status: Home / Self Care | Payer: BC Managed Care – PPO | Attending: Family Medicine | Admitting: Family Medicine

## 2018-12-22 DIAGNOSIS — Z20828 Contact with and (suspected) exposure to other viral communicable diseases: Secondary | ICD-10-CM

## 2018-12-22 DIAGNOSIS — Z20822 Contact with and (suspected) exposure to covid-19: Secondary | ICD-10-CM

## 2018-12-22 LAB — SARS CORONAVIRUS 2 AG (30 MIN TAT): SARS Coronavirus 2 Ag: NEGATIVE

## 2018-12-22 NOTE — ED Triage Notes (Signed)
Patient complains of sinus congestion x 2 days. Patient has been around his son who is positive.

## 2018-12-22 NOTE — ED Provider Notes (Signed)
[facility]    CSN: [id number] Arrival date & time: 12/22/18  1756  History   Chief Complaint Chief Complaint  Patient presents with  . Facial Pain   HPI  63 year old male presents for Covid testing.  Reported some congestion to the nurse but denies significant symptoms to me.  Does report that he has sneezed some.  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  His son tested positive for COVID-19 today.  Denies fever.  No other reported respiratory symptoms.  No cough.  No chills.  No other associated symptoms.  No other complaints concerns at this time.  PMH, Surgical Hx, Family Hx, Social History reviewed and updated as below.  Past Medical History:  Diagnosis Date  . GERD (gastroesophageal reflux disease)    OCC-NO MEDS  . Hypertension   . Left bundle branch block (LBBB)    ON EKG FROM 06-2015   Past Surgical History:  Procedure Laterality Date  . BLEPHAROPLASTY Bilateral 2014  . CARDIAC CATHETERIZATION  1998   normal  . CATARACT EXTRACTION W/ INTRAOCULAR LENS  IMPLANT, BILATERAL  2014  . COLONOSCOPY  2008  . KNEE ARTHROSCOPY Right 2000  . SHOULDER SURGERY Left   . UMBILICAL HERNIA REPAIR N/A 10/10/2015   Procedure: HERNIA REPAIR UMBILICAL ADULT with mesh;  Surgeon: Leonie Green, MD;  Location: [facility] ORS;  Service: General;  Laterality: N/A;   Home Medications    Prior to Admission medications   Medication Sig Start Date End Date Taking? Authorizing Provider  lisinopril-hydrochlorothiazide (PRINZIDE,ZESTORETIC) 10-12.5 MG per tablet Take 1 tablet by mouth daily.   Yes [provider]  Misc Natural Products (OSTEO BI-FLEX/5-LOXIN ADVANCED PO) Take by mouth.   Yes [provider]  HYDROcodone-acetaminophen (NORCO) 5-325 MG tablet Take 1-2 tablets by mouth every 4 (four) hours as needed for moderate pain. 10/10/15   Leonie Green, MD    Family History Family History  Problem Relation Age of Onset  . Stomach cancer  Father   . Colon cancer Neg Hx   . Esophageal cancer Neg Hx   . Rectal cancer Neg Hx     Social History Social History   Tobacco Use  . Smoking status: Former Smoker    Packs/day: 2.00    Years: 20.00    Pack years: 40.00    Types: Cigarettes  . Smokeless tobacco: Never Used  . Tobacco comment: e cigarettes CURRENTLY  Substance Use Topics  . Alcohol use: Yes    Alcohol/week: 4.0 standard drinks    Types: 4 Cans of beer per week    Comment: OCC  . Drug use: No     Allergies   Patient has no known allergies.   Review of Systems Review of Systems  Constitutional: Negative.   HENT: Positive for sneezing.   Respiratory: Negative.    Physical Exam Triage Vital Signs ED Triage Vitals  Enc Vitals Group     BP 12/22/18 1812 (!) 149/79     Pulse Rate 12/22/18 1812 77     Resp 12/22/18 1812 20     Temp 12/22/18 1812 98.9 F (37.2 C)     Temp Source 12/22/18 1812 Oral     SpO2 12/22/18 1812 99 %     Weight 12/22/18 1810 264 lb (119.7 kg)     Height --      Head Circumference --      Peak Flow --      Pain Score  12/22/18 1809 0     Pain Loc --      Pain Edu? --      Excl. in [place]? --     Updated Vital Signs BP (!) 149/79 (BP Location: Left Arm)   Pulse 77   Temp 98.9 F (37.2 C) (Oral)   Resp 20   Wt 119.7 kg   SpO2 99%   BMI 35.80 kg/m   Visual Acuity Right Eye Distance:   Left Eye Distance:   Bilateral Distance:    Right Eye Near:   Left Eye Near:    Bilateral Near:     Physical Exam Vitals signs and nursing note reviewed.  Constitutional:      General: He is not in acute distress.    Appearance: Normal appearance. He is not ill-appearing.  HENT:     Head: Normocephalic and atraumatic.  Eyes:     General:        Right eye: No discharge.        Left eye: No discharge.     Conjunctiva/sclera: Conjunctivae normal.  Cardiovascular:     Rate and Rhythm: Normal rate and regular rhythm.     Heart sounds: No murmur.  Pulmonary:     Effort:  Pulmonary effort is normal.     Breath sounds: Normal breath sounds. No wheezing, rhonchi or rales.  Neurological:     Mental Status: He is alert.  Psychiatric:        Mood and Affect: Mood normal.        Behavior: Behavior normal.    UC Treatments / Results  Labs (all labs ordered are listed, but only abnormal results are displayed) Labs Reviewed  SARS CORONAVIRUS 2 AG (30 MIN TAT)    EKG   Radiology No results found.  Procedures Procedures (including critical care time)  Medications Ordered in UC Medications - No data to display  Initial Impression / Assessment and Plan / UC Course  I have reviewed the triage vital signs and the nursing notes.  Pertinent labs & imaging results that were available during my care of the patient were reviewed by me and considered in my medical decision making (see chart for details).    63 year old male presents with exposure to COVID-19.  Patient is well-appearing.  Cover test negative today.  Supportive care.  Final Clinical Impressions(s) / UC Diagnoses   Final diagnoses:  Exposure to COVID-19 virus     Discharge Instructions     Testing negative.  Take care  Dr. Lacinda Axon    ED Prescriptions    None     PDMP not reviewed this encounter.   Coral Spikes, [place] 12/22/18 2006

## 2018-12-22 NOTE — Discharge Instructions (Signed)
Testing negative.  Take care  Dr. Lacinda Axon

## 2018-12-31 ENCOUNTER — Ambulatory Visit (AMBULATORY_SURGERY_CENTER): Payer: Self-pay

## 2018-12-31 ENCOUNTER — Encounter: Payer: Self-pay | Admitting: Internal Medicine

## 2018-12-31 ENCOUNTER — Other Ambulatory Visit: Payer: Self-pay

## 2018-12-31 VITALS — Temp 96.6°F | Ht 72.0 in | Wt 267.4 lb

## 2018-12-31 DIAGNOSIS — Z86010 Personal history of colonic polyps: Secondary | ICD-10-CM

## 2018-12-31 MED ORDER — NA SULFATE-K SULFATE-MG SULF 17.5-3.13-1.6 GM/177ML PO SOLN: 1.0000 | Freq: Once | ORAL | 0 refills | Status: AC

## 2018-12-31 NOTE — Progress Notes (Signed)
Denies allergies to eggs or soy products. Denies complication of anesthesia or sedation. Denies use of weight loss medication. Denies use of O2.   Emmi instructions given for colonoscopy.  Covid screening is scheduled for Monday 01/11/19 @ 11:50 Am. A 15.00 coupon for Suprep was given to the patient.

## 2019-01-11 ENCOUNTER — Other Ambulatory Visit: Payer: Self-pay | Admitting: Internal Medicine

## 2019-01-11 ENCOUNTER — Ambulatory Visit (INDEPENDENT_AMBULATORY_CARE_PROVIDER_SITE_OTHER): Payer: BC Managed Care – PPO

## 2019-01-11 DIAGNOSIS — Z1159 Encounter for screening for other viral diseases: Secondary | ICD-10-CM

## 2019-01-12 LAB — SARS CORONAVIRUS 2 (TAT 6-24 HRS): SARS Coronavirus 2: NEGATIVE

## 2019-01-14 ENCOUNTER — Encounter: Payer: Self-pay | Admitting: Internal Medicine

## 2019-01-14 ENCOUNTER — Ambulatory Visit (AMBULATORY_SURGERY_CENTER): Payer: BC Managed Care – PPO | Admitting: Internal Medicine

## 2019-01-14 ENCOUNTER — Other Ambulatory Visit: Payer: Self-pay

## 2019-01-14 VITALS — BP 138/76 | HR 58 | Temp 98.0°F | Resp 17 | Ht 72.0 in | Wt 267.4 lb

## 2019-01-14 DIAGNOSIS — D122 Benign neoplasm of ascending colon: Secondary | ICD-10-CM

## 2019-01-14 DIAGNOSIS — Z86010 Personal history of colonic polyps: Secondary | ICD-10-CM

## 2019-01-14 DIAGNOSIS — D124 Benign neoplasm of descending colon: Secondary | ICD-10-CM | POA: Diagnosis not present

## 2019-01-14 DIAGNOSIS — D123 Benign neoplasm of transverse colon: Secondary | ICD-10-CM

## 2019-01-14 MED ORDER — SODIUM CHLORIDE 0.9 % IV SOLN: 500.0000 mL | Freq: Once | INTRAVENOUS | Status: DC

## 2019-01-14 NOTE — Patient Instructions (Signed)
YOU HAD AN ENDOSCOPIC PROCEDURE TODAY AT THE [facility]:   Refer to the procedure report that was given to you for any specific questions about what was found during the examination.  If the procedure report does not answer your questions, please call your gastroenterologist to clarify.  If you requested that your care partner not be given the details of your procedure findings, then the procedure report has been included in a sealed envelope for you to review at your convenience later.  YOU SHOULD EXPECT: Some feelings of bloating in the abdomen. Passage of more gas than usual.  Walking can help get rid of the air that was put into your GI tract during the procedure and reduce the bloating. If you had a lower endoscopy (such as a colonoscopy or flexible sigmoidoscopy) you may notice spotting of blood in your stool or on the toilet paper. If you underwent a bowel prep for your procedure, you may not have a normal bowel movement for a few days.  Please Note:  You might notice some irritation and congestion in your nose or some drainage.  This is from the oxygen used during your procedure.  There is no need for concern and it should clear up in a day or so.  SYMPTOMS TO REPORT IMMEDIATELY:   Following lower endoscopy (colonoscopy or flexible sigmoidoscopy):  Excessive amounts of blood in the stool  Significant tenderness or worsening of abdominal pains  Swelling of the abdomen that is new, acute  Fever of 100F or higher  For urgent or emergent issues, a gastroenterologist can be reached at any hour by calling [phone].   DIET:  We do recommend a small meal at first, but then you may proceed to your regular diet.  Drink plenty of fluids but you should avoid alcoholic beverages for 24 hours.  ACTIVITY:  You should plan to take it easy for the rest of today and you should NOT DRIVE or use heavy machinery until tomorrow (because of the sedation medicines used during the test).     FOLLOW UP: Our staff will call the number listed on your records 48-72 hours following your procedure to check on you and address any questions or concerns that you may have regarding the information given to you following your procedure. If we do not reach you, we will leave a message.  We will attempt to reach you two times.  During this call, we will ask if you have developed any symptoms of COVID 19. If you develop any symptoms (ie: fever, flu-like symptoms, shortness of breath, cough etc.) before then, please call [phone].  If you test positive for Covid 19 in the 2 weeks post procedure, please call and report this information to us.    If any biopsies were taken you will be contacted by phone or by letter within the next 1-3 weeks.  Please call us at [phone] if you have not heard about the biopsies in 3 weeks.    SIGNATURES/CONFIDENTIALITY: You and/or your care partner have signed paperwork which will be entered into your electronic medical record.  These signatures attest to the fact that that the information above on your After Visit Summary has been reviewed and is understood.  Full responsibility of the confidentiality of this discharge information lies with you and/or your care-partner. 

## 2019-01-14 NOTE — Op Note (Signed)
[place] Patient Name: Ryan Thomas Procedure Date: 01/14/2019 8:52 AM MRN: [id number] Endoscopist: Jerene Bears , MD Age: 63 Referring MD:  Date of Birth: 09-05-1955 Gender: Male Account #: [id number] Procedure:                Colonoscopy Indications:              High risk colon cancer surveillance: Personal                            history of non-advanced adenoma, Last colonoscopy 5                            years ago Medicines:                Monitored Anesthesia Care Procedure:                Pre-Anesthesia Assessment:                           - Prior to the procedure, a History and Physical                            was performed, and patient medications and                            allergies were reviewed. The patient's tolerance of                            previous anesthesia was also reviewed. The risks                            and benefits of the procedure and the sedation                            options and risks were discussed with the patient.                            All questions were answered, and informed consent                            was obtained. Prior Anticoagulants: The patient has                            taken no previous anticoagulant or antiplatelet                            agents. ASA Grade Assessment: II - A patient with                            mild systemic disease. After reviewing the risks                            and benefits, the patient was deemed in  satisfactory condition to undergo the procedure.                           After obtaining informed consent, the colonoscope                            was passed under direct vision. Throughout the                            procedure, the patient's blood pressure, pulse, and                            oxygen saturations were monitored continuously. The                            Colonoscope was introduced through the anus and                      advanced to the cecum, identified by appendiceal                            orifice and ileocecal valve. The colonoscopy was                            performed without difficulty. The patient tolerated                            the procedure well. The quality of the bowel                            preparation was good. The ileocecal valve,                            appendiceal orifice, and rectum were photographed. Scope In: 9:00:46 AM Scope Out: 9:19:27 AM Scope Withdrawal Time: 0 hours 15 minutes 40 seconds  Total Procedure Duration: 0 hours 18 minutes 41 seconds  Findings:                 The digital rectal exam was normal.                           A 5 mm polyp was found in the ascending colon. The                            polyp was sessile. The polyp was removed with a                            cold snare. Resection and retrieval were complete.                           Two sessile polyps were found in the transverse                            colon. The polyps were 4 to 5 mm in size. These  polyps were removed with a cold snare. Resection                            and retrieval were complete.                           A 5 mm polyp was found in the descending colon. The                            polyp was sessile. The polyp was removed with a                            cold snare. Resection and retrieval were complete.                           A few medium-mouthed diverticula were found in the                            sigmoid colon and descending colon.                           Internal hemorrhoids were found during                            retroflexion. The hemorrhoids were small. Complications:            No immediate complications. Estimated Blood Loss:     Estimated blood loss was minimal. Impression:               - One 5 mm polyp in the ascending colon, removed                            with a cold snare. Resected  and retrieved.                           - Two 4 to 5 mm polyps in the transverse colon,                            removed with a cold snare. Resected and retrieved.                           - One 5 mm polyp in the descending colon, removed                            with a cold snare. Resected and retrieved.                           - Diverticulosis in the sigmoid colon and in the                            descending colon.                           - Small internal hemorrhoids. Recommendation:           -  Patient has a contact number available for                            emergencies. The signs and symptoms of potential                            delayed complications were discussed with the                            patient. Return to normal activities tomorrow.                            Written discharge instructions were provided to the                            patient.                           - Resume previous diet.                           - Continue present medications.                           - Await pathology results.                           - Repeat colonoscopy is recommended for                            surveillance. The colonoscopy date will be                            determined after pathology results from today's                            exam become available for review. Jerene Bears, MD 01/14/2019 9:26:52 AM This report has been signed electronically.

## 2019-01-14 NOTE — Progress Notes (Signed)
Called to room to assist during endoscopic procedure.  Patient ID and intended procedure confirmed with present staff. Received instructions for my participation in the procedure from the performing physician.  

## 2019-01-14 NOTE — Progress Notes (Signed)
Temp   VS  CW  Pt's states no medical or surgical changes since previsit or office visit.   

## 2019-01-14 NOTE — Progress Notes (Signed)
Report given to PACU, vss 

## 2019-01-18 ENCOUNTER — Telehealth: Payer: Self-pay

## 2019-01-18 NOTE — Telephone Encounter (Signed)
  Follow up Call-  Call back number 01/14/2019  Post procedure Call Back phone  # [phone]  Permission to leave phone message Yes  Some recent data might be hidden     Patient questions:  Do you have a fever, pain , or abdominal swelling? No. Pain Score  0 *  Have you tolerated food without any problems? Yes.    Have you been able to return to your normal activities? Yes.    Do you have any questions about your discharge instructions: Diet   No. Medications  No. Follow up visit  No.  Do you have questions or concerns about your Care? No.  Actions: * If pain score is 4 or above: No action needed, pain <4.  1. Have you developed a fever since your procedure? no  2.   Have you had an respiratory symptoms (SOB or cough) since your procedure? no  3.   Have you tested positive for COVID 19 since your procedure no  4.   Have you had any family members/close contacts diagnosed with the COVID 19 since your procedure?  n0   If yes to any of these questions please route to Joylene John, RN and Alphonsa Gin, Therapist, sports.

## 2019-01-20 ENCOUNTER — Encounter: Payer: Self-pay | Admitting: Internal Medicine

## 2019-02-12 ENCOUNTER — Encounter: Payer: Self-pay | Admitting: Emergency Medicine

## 2019-02-12 ENCOUNTER — Ambulatory Visit
Admission: EM | Admit: 2019-02-12 | Discharge: 2019-02-12 | Disposition: A | Discharge status: Home / Self Care | Payer: BC Managed Care – PPO | Attending: Family Medicine | Admitting: Family Medicine

## 2019-02-12 ENCOUNTER — Other Ambulatory Visit: Payer: Self-pay

## 2019-02-12 DIAGNOSIS — Z20822 Contact with and (suspected) exposure to covid-19: Secondary | ICD-10-CM | POA: Diagnosis present

## 2019-02-12 DIAGNOSIS — J3489 Other specified disorders of nose and nasal sinuses: Secondary | ICD-10-CM | POA: Diagnosis not present

## 2019-02-12 DIAGNOSIS — Z7189 Other specified counseling: Secondary | ICD-10-CM | POA: Diagnosis present

## 2019-02-12 NOTE — ED Triage Notes (Signed)
Patient reports runny nose for couple of days.  Patient states that he was around his mother who tested positive for COVID and was admitted to [facility] yesterday.  Patient denies fevers.

## 2019-02-12 NOTE — Discharge Instructions (Addendum)
It was very nice seeing you today in clinic. Thank you for entrusting me with your care.   You were tested for SARS-CoV-2 (novel coronavirus) today. Testing is performed by an outside lab ([facility]) and has variable turn around times ranging between 2-5 days. Current recommendations from the the CDC and [place] DHHS require that you remain out of work in order to quarantine at home until negative test results are have been received. In the event that your test results are positive, you will be contacted with further directives. These measures are being implemented out of an abundance of caution to prevent transmission and spread during the current SARS-CoV-2 pandemic.   If you develop any worsening symptoms or concerns, make arrangements to follow up with your regular doctor. If your symptoms are severe, please seek follow up care in the ER. Please remember, our [place] providers are "right here with you" when you need us.   Again, it was my pleasure to take care of you today. Thank you for choosing our clinic. I hope that you start to feel better quickly.   Lucas Exline, MSN, APRN, FNP-C, CEN Advanced Practice Provider [facility]  

## 2019-02-13 LAB — NOVEL CORONAVIRUS, NAA (HOSP ORDER, SEND-OUT TO REF LAB; TAT 18-24 HRS): SARS-CoV-2, NAA: NOT DETECTED

## 2019-02-13 NOTE — ED Provider Notes (Signed)
[place], [place]   Name: Ryan Thomas DOB: 1955/05/28 MRN: [id number] CSN: [id number] PCP: Ryan Pink, MD  Arrival date and time:  02/12/19 0940  Chief Complaint:  Nasal Congestion and COVID exposure   NOTE: Prior to seeing the patient today, I have reviewed the triage nursing documentation and vital signs. Clinical staff has updated patient's PMH/PSHx, current medication list, and drug allergies/intolerances to ensure comprehensive history available to assist in medical decision making.   History:   HPI: Ryan Thomas is a 64 y.o. male who presents today with complaints of rhinorrhea that started approximately 2 days ago. Patient denies fevers. He denies any cough, shortness of breath, or wheezing.  He has not experienced any nausea, vomiting, diarrhea, or abdominal pain. He is eating and drinking well. Patient denies any perceived alterations to his sense of taste or smell. Patient presents out of concerns for his personal health after being exposed to someone who tested positive for SARS-CoV-2 (novel coronavirus). Patient advising that his elderly mother was admitted to [facility] yesterday for complications related to SARS-CoV-2. He has not been tested for SARS-CoV-2 (novel coronavirus) in the past 14 days; last tested negative on 01/11/2019 per his report.  He has never been tested for SARS-CoV-2 (novel coronavirus) in the past per his report. Patient has not been vaccinated for influenza this season. Despite his symptoms, patient has not taken any over the counter interventions to help improve/relieve his reported symptoms at home as symptoms have been mild.   Past Medical History:  Diagnosis Date  . GERD (gastroesophageal reflux disease)    OCC-NO MEDS  . Hypertension   . Left bundle branch block (LBBB)    ON EKG FROM 06-2015    Past Surgical History:  Procedure Laterality Date  . BLEPHAROPLASTY Bilateral 2014  . CARDIAC CATHETERIZATION  1998   normal  . CATARACT EXTRACTION W/  INTRAOCULAR LENS  IMPLANT, BILATERAL  2014  . COLONOSCOPY  2008  . KNEE ARTHROSCOPY Right 2000  . SHOULDER SURGERY Left   . UMBILICAL HERNIA REPAIR N/A 10/10/2015   Procedure: HERNIA REPAIR UMBILICAL ADULT with mesh;  Surgeon: Leonie Green, MD;  Location: [facility] ORS;  Service: General;  Laterality: N/A;    Family History  Problem Relation Age of Onset  . Stomach cancer Father   . Colon cancer Neg Hx   . Esophageal cancer Neg Hx   . Rectal cancer Neg Hx     Social History   Tobacco Use  . Smoking status: Former Smoker    Packs/day: 2.00    Years: 20.00    Pack years: 40.00    Types: Cigarettes  . Smokeless tobacco: Never Used  Substance Use Topics  . Alcohol use: Yes    Alcohol/week: 4.0 standard drinks    Types: 4 Cans of beer per week    Comment: OCC  . Drug use: No    There are no problems to display for this patient.   Home Medications:    Current Meds  Medication Sig  . lisinopril-hydrochlorothiazide (PRINZIDE,ZESTORETIC) 10-12.5 MG per tablet Take 1 tablet by mouth daily.  Marland Kitchen OVER THE COUNTER MEDICATION Vitamin D 3, one capsule daily.  Marland Kitchen OVER THE COUNTER MEDICATION Zinc one capsule daily.    Allergies:   Patient has no known allergies.  Review of Systems (ROS): Review of Systems  Constitutional: Negative for fatigue and fever.  HENT: Positive for rhinorrhea. Negative for congestion, ear pain, postnasal drip, sinus pressure, sinus pain, sneezing and sore  throat.   Eyes: Negative for pain, discharge and redness.  Respiratory: Negative for cough, chest tightness and shortness of breath.   Cardiovascular: Negative for chest pain and palpitations.  Gastrointestinal: Negative for abdominal pain, diarrhea, nausea and vomiting.  Musculoskeletal: Negative for arthralgias, back pain, myalgias and neck pain.  Skin: Negative for color change, pallor and rash.  Neurological: Negative for dizziness, syncope, weakness and headaches.  Hematological: Negative for  adenopathy.     Vital Signs: Today's Vitals   02/12/19 0959 02/12/19 1002 02/12/19 1027  BP:  (!) 155/92   Pulse:  61   Resp:  16   Temp:  98.2 F (36.8 C)   TempSrc:  Oral   SpO2:  99%   Weight: 265 lb (120.2 kg)    Height: 6' (1.829 m)    PainSc: 0-No pain  2     Physical Exam: Physical Exam  Constitutional: He is oriented to person, place, and time and well-developed, well-nourished, and in no distress.  HENT:  Head: Normocephalic and atraumatic.  Nose: Rhinorrhea present. No mucosal edema or sinus tenderness.  Eyes: Pupils are equal, round, and reactive to light.  Cardiovascular: Normal rate, regular rhythm, normal heart sounds and intact distal pulses.  Pulmonary/Chest: Effort normal and breath sounds normal.  Neurological: He is alert and oriented to person, place, and time. Gait normal.  Skin: Skin is warm and dry. No rash noted. He is not diaphoretic.  Psychiatric: Mood, memory, affect and judgment normal.  Nursing note and vitals reviewed.   Urgent Care Treatments / Results:   Orders Placed This Encounter  Procedures  . Novel Coronavirus, NAA (Hosp order, Send-out to Ref Lab; TAT 18-24 hrs    LABS: PLEASE NOTE: all labs that were ordered this encounter are listed, however only abnormal results are displayed. Labs Reviewed  NOVEL CORONAVIRUS, NAA (HOSP ORDER, SEND-OUT TO REF LAB; TAT 18-24 HRS)    EKG: -None  RADIOLOGY: No results found.  PROCEDURES: Procedures  MEDICATIONS RECEIVED THIS VISIT: Medications - No data to display  PERTINENT CLINICAL COURSE NOTES/UPDATES:   Initial Impression / Assessment and Plan / Urgent Care Course:  Pertinent labs & imaging results that were available during my care of the patient were personally reviewed by me and considered in my medical decision making (see lab/imaging section of note for values and interpretations).  Ryan Thomas is a 64 y.o. male who presents to [facility] today with complaints of  Nasal Congestion and COVID exposure   Patient overall well appearing and in no acute distress today in clinic. Presenting symptoms (see HPI) and exam as documented above. He presents with symptoms associated with SARS-CoV-2 (novel coronavirus). Discussed typical symptom constellation. Reviewed potential for infection and need for testing. Patient amenable to being tested. SARS-CoV-2 swab collected by certified clinical staff. Discussed variable turn around times associated with testing, as swabs are being processed at [facility], and have been taking between 24-48 hours to come back. He was advised to self quarantine, per [facility] DHHS guidelines, until negative results received. These measures are being implemented out of an abundance of caution to prevent transmission and spread during the current SARS-CoV-2 pandemic.  Presenting symptoms consistent with weather change or possible viral etiology. Exam reassuring. Doubt SARS-CoV-2, however until ruled out with confirmatory lab testing, SARS-CoV-2 remains part of the differential given his close exposure. His testing is pending at this time. I discussed with him that his symptoms are felt to be viral in nature, thus antibiotics  would not offer him any relief or improve his symptoms any faster than conservative symptomatic management. Discussed supportive care measures at home during acute phase of illness. Patient to rest as much as possible. He was encouraged to ensure adequate hydration to prevent dehydration and electrolyte derangements. Patient may use APAP and/or IBU on an as needed basis for pain/fever.    Discussed follow up with primary care physician in 1 week for re-evaluation. I have reviewed the follow up and strict return precautions for any new or worsening symptoms. Patient is aware of symptoms that would be deemed urgent/emergent, and would thus require further evaluation either here or in the emergency department. At the time of discharge, he  verbalized understanding and consent with the discharge plan as it was reviewed with him. All questions were fielded by provider and/or clinic staff prior to patient discharge.    Final Clinical Impressions / Urgent Care Diagnoses:   Final diagnoses:  Close exposure to COVID-19 virus  Encounter for laboratory testing for COVID-19 virus  Advice given about COVID-19 virus infection    New Prescriptions:  [place] Controlled Substance Registry consulted? Not Applicable  No orders of the defined types were placed in this encounter.   Recommended Follow up Care:  Patient encouraged to follow up with the following provider within the specified time frame, or sooner as dictated by the severity of his symptoms. As always, he was instructed that for any urgent/emergent care needs, he should seek care either here or in the emergency department for more immediate evaluation.  Follow-up Information    Ryan Pink, MD.   Specialty: Family Medicine Why: As needed Contact information: [address] [facility] [place] [address] [phone]         NOTE: This note was prepared using Dragon dictation software along with smaller phrase technology. Despite my best ability to proofread, there is the potential that transcriptional errors may still occur from this process, and are completely unintentional.    Karen Kitchens, NP 02/13/19 1308

## 2019-04-01 ENCOUNTER — Ambulatory Visit: Payer: BC Managed Care – PPO | Attending: Internal Medicine

## 2019-04-01 DIAGNOSIS — Z23 Encounter for immunization: Secondary | ICD-10-CM | POA: Insufficient documentation

## 2019-04-01 NOTE — Progress Notes (Signed)
   Covid-19 Vaccination Clinic  Name:  Ryan Thomas    MRN: [id number] DOB: 1955-05-15  04/01/2019  [name] was observed post Covid-19 immunization for 15 minutes without incident. He was provided with Vaccine Information Sheet and instruction to access the V-Safe system.   [name] was instructed to call 911 with any severe reactions post vaccine: Marland Kitchen Difficulty breathing  . Swelling of face and throat  . A fast heartbeat  . A bad rash all over body  . Dizziness and weakness   Immunizations Administered    Name Date Dose VIS Date Route   Pfizer COVID-19 Vaccine 04/01/2019  9:16 AM 0.3 mL 01/08/2019 Intramuscular   Manufacturer: [place]   Lot: [id number]   [place]: [id number]

## 2019-04-22 ENCOUNTER — Ambulatory Visit: Payer: BC Managed Care – PPO | Attending: Internal Medicine

## 2019-04-22 DIAGNOSIS — Z23 Encounter for immunization: Secondary | ICD-10-CM

## 2019-04-22 NOTE — Progress Notes (Signed)
   Covid-19 Vaccination Clinic  Name:  Ryan Thomas    MRN: [id number] DOB: 09/02/1955  04/22/2019  Mr. [name] was observed post Covid-19 immunization for 15 minutes without incident. He was provided with Vaccine Information Sheet and instruction to access the V-Safe system.   [name] was instructed to call 911 with any severe reactions post vaccine: Marland Kitchen Difficulty breathing  . Swelling of face and throat  . A fast heartbeat  . A bad rash all over body  . Dizziness and weakness   Immunizations Administered    Name Date Dose VIS Date Route   Pfizer COVID-19 Vaccine 04/22/2019  9:05 AM 0.3 mL 01/08/2019 Intramuscular   Manufacturer: [employer]   Lot: [id number]   [place]: [id number]

## 2019-05-11 ENCOUNTER — Other Ambulatory Visit (HOSPITAL_COMMUNITY): Payer: Self-pay | Admitting: Student

## 2019-05-11 ENCOUNTER — Other Ambulatory Visit: Payer: Self-pay | Admitting: Radiology

## 2019-05-11 ENCOUNTER — Other Ambulatory Visit (HOSPITAL_COMMUNITY): Payer: Self-pay | Admitting: Radiology

## 2019-05-11 DIAGNOSIS — R1031 Right lower quadrant pain: Secondary | ICD-10-CM

## 2019-05-11 DIAGNOSIS — R1032 Left lower quadrant pain: Secondary | ICD-10-CM

## 2019-05-14 ENCOUNTER — Other Ambulatory Visit: Payer: Self-pay

## 2019-05-14 ENCOUNTER — Ambulatory Visit
Admission: RE | Admit: 2019-05-14 | Discharge: 2019-05-14 | Disposition: A | Discharge status: Home / Self Care | Payer: BC Managed Care – PPO | Source: Ambulatory Visit | Attending: Student | Admitting: Student

## 2019-05-14 DIAGNOSIS — R1032 Left lower quadrant pain: Secondary | ICD-10-CM | POA: Insufficient documentation

## 2019-05-14 DIAGNOSIS — R1031 Right lower quadrant pain: Secondary | ICD-10-CM | POA: Insufficient documentation

## 2019-07-19 ENCOUNTER — Ambulatory Visit: Payer: BC Managed Care – PPO | Admitting: Podiatry

## 2020-01-27 ENCOUNTER — Ambulatory Visit
Admission: EM | Admit: 2020-01-27 | Discharge: 2020-01-27 | Disposition: A | Discharge status: Home / Self Care | Payer: BC Managed Care – PPO | Attending: Internal Medicine | Admitting: Internal Medicine

## 2020-01-27 ENCOUNTER — Ambulatory Visit (INDEPENDENT_AMBULATORY_CARE_PROVIDER_SITE_OTHER): Payer: BC Managed Care – PPO

## 2020-01-27 ENCOUNTER — Other Ambulatory Visit: Payer: Self-pay

## 2020-01-27 DIAGNOSIS — U071 COVID-19: Secondary | ICD-10-CM | POA: Insufficient documentation

## 2020-01-27 DIAGNOSIS — R509 Fever, unspecified: Secondary | ICD-10-CM

## 2020-01-27 DIAGNOSIS — R059 Cough, unspecified: Secondary | ICD-10-CM

## 2020-01-27 LAB — RESP PANEL BY RT-PCR (FLU A&B, COVID) ARPGX2
Influenza A by PCR: NEGATIVE
Influenza B by PCR: NEGATIVE
SARS Coronavirus 2 by RT PCR: POSITIVE — AB

## 2020-01-27 MED ORDER — BENZONATATE 200 MG PO CAPS: 200.0000 mg | ORAL_CAPSULE | Freq: Two times a day (BID) | ORAL | 0 refills | Status: AC | PRN

## 2020-01-27 NOTE — ED Provider Notes (Signed)
[facility]    CSN: [id number] Arrival date & time: 01/27/20  1551      History   Chief Complaint Chief Complaint  Patient presents with  . Cough  . Fever    HPI Ryan Thomas is a 64 y.o. male who presents with worse cough on day 5 of taking the Zpack last week for bronchitis which seemed to help him til the last day when he got worse.Marland Kitchen Has had a fever up to 100.2. Was exposed to covid 12/28. Onset of body aches today. Has been sweating today. Denies SOB, CP or GI symptoms    Past Medical History:  Diagnosis Date  . GERD (gastroesophageal reflux disease)    OCC-NO MEDS  . Hypertension   . Left bundle branch block (LBBB)    ON EKG FROM 06-2015    There are no problems to display for this patient.   Past Surgical History:  Procedure Laterality Date  . BLEPHAROPLASTY Bilateral 2014  . CARDIAC CATHETERIZATION  1998   normal  . CATARACT EXTRACTION W/ INTRAOCULAR LENS  IMPLANT, BILATERAL  2014  . COLONOSCOPY  2008  . KNEE ARTHROSCOPY Right 2000  . SHOULDER SURGERY Left   . UMBILICAL HERNIA REPAIR N/A 10/10/2015   Procedure: HERNIA REPAIR UMBILICAL ADULT with mesh;  Surgeon: Leonie Green, MD;  Location: [facility] ORS;  Service: General;  Laterality: N/A;       Home Medications    Prior to Admission medications   Medication Sig Start Date End Date Taking? Authorizing Provider  benzonatate (TESSALON) 200 MG capsule Take 1 capsule (200 mg total) by mouth 2 (two) times daily as needed for cough. 01/27/20  Yes Rodriguez-Southworth, Sunday Spillers, PA-C  clotrimazole-betamethasone (LOTRISONE) cream Apply topically. 07/14/19  Yes [provider]  tadalafil (CIALIS) 5 MG tablet Take by mouth. 08/26/19 08/25/20 Yes [provider]  ascorbic acid (VITAMIN C) 1000 MG tablet Take by mouth.    [provider]  lisinopril-hydrochlorothiazide (PRINZIDE,ZESTORETIC) 10-12.5 MG per tablet Take 1 tablet by mouth daily.    [provider]  Misc  Natural Products (OSTEO BI-FLEX/5-LOXIN ADVANCED PO) Take by mouth.    [provider]  OVER THE COUNTER MEDICATION Vitamin D 3, one capsule daily.    [provider]  OVER THE COUNTER MEDICATION Zinc one capsule daily.    [provider]    Family History Family History  Problem Relation Age of Onset  . Stomach cancer Father   . Dementia Mother   . Colon cancer Neg Hx   . Esophageal cancer Neg Hx   . Rectal cancer Neg Hx     Social History Social History   Tobacco Use  . Smoking status: Former Smoker    Packs/day: 2.00    Years: 20.00    Pack years: 40.00    Types: Cigarettes  . Smokeless tobacco: Never Used  Vaping Use  . Vaping Use: Former  Substance Use Topics  . Alcohol use: Yes    Alcohol/week: 4.0 standard drinks    Types: 4 Cans of beer per week    Comment: OCC  . Drug use: No     Allergies   Patient has no known allergies.   Review of Systems Review of Systems  Constitutional: Positive for diaphoresis, fatigue and fever. Negative for appetite change and chills.  HENT: Positive for postnasal drip. Negative for congestion, ear discharge, ear pain, nosebleeds, sore throat and trouble swallowing.   Eyes: Negative for discharge.  Respiratory:  Positive for cough. Negative for chest tightness.   Cardiovascular: Negative for chest pain.  Gastrointestinal: Negative for abdominal pain, diarrhea, nausea and vomiting.  Musculoskeletal: Positive for myalgias. Negative for gait problem.  Skin: Negative for rash.  Neurological: Negative for headaches.  Hematological: Negative for adenopathy.     Physical Exam Triage Vital Signs ED Triage Vitals  Enc Vitals Group     BP 01/27/20 1758 (S) (!) 155/67     Pulse Rate 01/27/20 1758 86     Resp 01/27/20 1758 16     Temp 01/27/20 1758 99.4 F (37.4 C)     Temp Source 01/27/20 1758 Oral     SpO2 01/27/20 1758 97 %     Weight 01/27/20 1737 275 lb (124.7 kg)     Height --      Head  Circumference --      Peak Flow --      Pain Score 01/27/20 1737 0     Pain Loc --      Pain Edu? --      Excl. in GC? --    No data found.  Updated Vital Signs BP (S) (!) 155/67 (BP Location: Right Arm)   Pulse 86   Temp 99.4 F (37.4 C) (Oral)   Resp 16   Wt 275 lb (124.7 kg)   SpO2 97%   BMI 37.30 kg/m   Visual Acuity Right Eye Distance:   Left Eye Distance:   Bilateral Distance:    Right Eye Near:   Left Eye Near:    Bilateral Near:     Physical Exam Constitutional:      General: He is not in acute distress.    Appearance: He is obese. He is not toxic-appearing.  HENT:     Head: Normocephalic.     Right Ear: Tympanic membrane, ear canal and external ear normal.     Left Ear: Tympanic membrane, ear canal and external ear normal.     Nose: Nose normal.     Mouth/Throat:     Mouth: Mucous membranes are moist.  Eyes:     General: No scleral icterus.    Conjunctiva/sclera: Conjunctivae normal.  Cardiovascular:     Heart sounds: No murmur heard.   Pulmonary:     Effort: Pulmonary effort is normal.     Breath sounds: Normal breath sounds. No wheezing.  Musculoskeletal:        General: Normal range of motion.     Cervical back: Neck supple.  Lymphadenopathy:     Cervical: No cervical adenopathy.  Skin:    General: Skin is warm.     Findings: No rash.     Comments: clammy  Neurological:     Mental Status: He is alert and oriented to person, place, and time.     Gait: Gait normal.  Psychiatric:        Mood and Affect: Mood normal.        Behavior: Behavior normal.        Thought Content: Thought content normal.        Judgment: Judgment normal.      UC Treatments / Results  Labs (all labs ordered are listed, but only abnormal results are displayed) Labs Reviewed  RESP PANEL BY RT-PCR (FLU A&B, COVID) ARPGX2 - Abnormal; Notable for the following components:      Result Value   SARS Coronavirus 2 by RT PCR POSITIVE (*)    All other components  within normal limits  EKG   Radiology DG Chest 2 View  Result Date: 01/27/2020 CLINICAL DATA:  Cough and fever. EXAM: CHEST - 2 VIEW COMPARISON:  None. FINDINGS: Borderline hyperinflation.The cardiomediastinal contours are normal. The lungs are clear. Pulmonary vasculature is normal. No consolidation, pleural effusion, or pneumothorax. Multilevel thoracic spondylosis with endplate spurring. No acute osseous abnormalities are seen. IMPRESSION: Borderline hyperinflation which may be regular is inspiratory effort, asthma or bronchitis. There is no focal airspace disease. Electronically Signed   By: Narda Rutherford M.D.   On: 01/27/2020 18:44    Procedures Procedures (including critical care time)  Medications Ordered in UC Medications - No data to display  Initial Impression / Assessment and Plan / UC Course  I have reviewed the triage vital signs and the nursing notes. Has covid infection. I sent Tessalon perless for cough. Was referred to get MAB. See instructions.  Pertinent labs & imaging results that were available during my care of the patient were reviewed by me and considered in my medical decision making (see chart for details).  Final Clinical Impressions(s) / UC Diagnoses   Final diagnoses:  COVID-19 virus infection     Discharge Instructions     Due to your chronic conditions and if your covid test is positive, you qualify to receive Monoclonal Antibody infusion and if you don't hear from anyone within 24 hours of finding your results, call [phone]  I have sent them your information to contact you.   You may take the following supplements to help your immune system be stronger to fight this viral infection Take Quarcetin 500 mg three times a day x 7 days with Zinc 50 mg ones a day x 7 days. The quarcetin is an antiviral and anti-inflammatory supplement which helps open the zinc channels in the cell to absorb Zinc. Zinc helps decrease the virus load in your body.  Take Melatonin 6-10 mg at bed time which also helps support your immune system.  Also make sure to take Vit D 5,000 IU per day with a fatty meal and Vit C 5000 mg a day until you are completely better. To prevent viral illnesses your vitamin D should be between 60-80. Stay on Vitamin D 2,000  and C  1000 mg the rest of the season.  Don't lay around, keep active and walk as much as you are able to to prevent worsening of your symptoms.  Follow up with your family Dr next week.  If you get short of breath and you are able to check  your oxygen with a pulse oxygen meter, if it gets to 92% or less, you need to go to the hospital to be admitted. If you dont have one, come back here and we will assess you.       ED Prescriptions    Medication Sig Dispense Auth. Provider   benzonatate (TESSALON) 200 MG capsule Take 1 capsule (200 mg total) by mouth 2 (two) times daily as needed for cough. 30 capsule Rodriguez-Southworth, Nettie Elm, PA-C     PDMP not reviewed this encounter.   Garey Ham, [place] 01/27/20 2235

## 2020-01-27 NOTE — Discharge Instructions (Signed)
Due to your chronic conditions and if your covid test is positive, you qualify to receive Monoclonal Antibody infusion and if you don't hear from anyone within 24 hours of finding your results, call [phone]  I have sent them your information to contact you.   You may take the following supplements to help your immune system be stronger to fight this viral infection Take Quarcetin 500 mg three times a day x 7 days with Zinc 50 mg ones a day x 7 days. The quarcetin is an antiviral and anti-inflammatory supplement which helps open the zinc channels in the cell to absorb Zinc. Zinc helps decrease the virus load in your body. Take Melatonin 6-10 mg at bed time which also helps support your immune system.  Also make sure to take Vit D 5,000 IU per day with a fatty meal and Vit C 5000 mg a day until you are completely better. To prevent viral illnesses your vitamin D should be between 60-80. Stay on Vitamin D 2,000  and C  1000 mg the rest of the season.  Don't lay around, keep active and walk as much as you are able to to prevent worsening of your symptoms.  Follow up with your family Dr next week.  If you get short of breath and you are able to check  your oxygen with a pulse oxygen meter, if it gets to 92% or less, you need to go to the hospital to be admitted. If you dont have one, come back here and we will assess you.

## 2020-01-27 NOTE — ED Triage Notes (Signed)
Patient presents to Urgent Care with complaints of fever last temp 100.2, cough, exposed to Covid 12/28 . Symptoms started last weds. Patient reports symptoms have not improved and started developing body aches. Had a negative rapid test today.   Treating with tylenol last 3 hrs ago.   Denies N/V or diarrhea.

## 2020-09-20 ENCOUNTER — Other Ambulatory Visit: Payer: Self-pay

## 2020-09-20 ENCOUNTER — Emergency Department
Admission: EM | Admit: 2020-09-20 | Discharge: 2020-09-21 | Disposition: A | Discharge status: Home / Self Care | Payer: BC Managed Care – PPO | Attending: Emergency Medicine | Admitting: Emergency Medicine

## 2020-09-20 ENCOUNTER — Emergency Department: Payer: BC Managed Care – PPO

## 2020-09-20 ENCOUNTER — Encounter: Payer: Self-pay | Admitting: Emergency Medicine

## 2020-09-20 DIAGNOSIS — Z87891 Personal history of nicotine dependence: Secondary | ICD-10-CM | POA: Insufficient documentation

## 2020-09-20 DIAGNOSIS — Z79899 Other long term (current) drug therapy: Secondary | ICD-10-CM | POA: Insufficient documentation

## 2020-09-20 DIAGNOSIS — R002 Palpitations: Secondary | ICD-10-CM | POA: Diagnosis present

## 2020-09-20 DIAGNOSIS — Z20822 Contact with and (suspected) exposure to covid-19: Secondary | ICD-10-CM | POA: Insufficient documentation

## 2020-09-20 DIAGNOSIS — R61 Generalized hyperhidrosis: Secondary | ICD-10-CM | POA: Insufficient documentation

## 2020-09-20 DIAGNOSIS — R110 Nausea: Secondary | ICD-10-CM | POA: Diagnosis not present

## 2020-09-20 DIAGNOSIS — I10 Essential (primary) hypertension: Secondary | ICD-10-CM | POA: Diagnosis not present

## 2020-09-20 LAB — CBC
HCT: 45.8 % (ref 39.0–52.0)
Hemoglobin: 15.2 g/dL (ref 13.0–17.0)
MCH: 28.7 pg (ref 26.0–34.0)
MCHC: 33.2 g/dL (ref 30.0–36.0)
MCV: 86.6 fL (ref 80.0–100.0)
Platelets: 273 10*3/uL (ref 150–400)
RBC: 5.29 MIL/uL (ref 4.22–5.81)
RDW: 13.4 % (ref 11.5–15.5)
WBC: 13 10*3/uL — ABNORMAL HIGH (ref 4.0–10.5)
nRBC: 0 % (ref 0.0–0.2)

## 2020-09-20 LAB — BASIC METABOLIC PANEL
Anion gap: 10 (ref 5–15)
BUN: 16 mg/dL (ref 8–23)
CO2: 29 mmol/L (ref 22–32)
Calcium: 9.4 mg/dL (ref 8.9–10.3)
Chloride: 99 mmol/L (ref 98–111)
Creatinine, Ser: 2.07 mg/dL — ABNORMAL HIGH (ref 0.61–1.24)
GFR, Estimated: 35 mL/min — ABNORMAL LOW (ref >60)
Glucose, Bld: 113 mg/dL — ABNORMAL HIGH (ref 70–99)
Potassium: 3.8 mmol/L (ref 3.5–5.1)
Sodium: 138 mmol/L (ref 135–145)

## 2020-09-20 LAB — RESP PANEL BY RT-PCR (FLU A&B, COVID) ARPGX2
Influenza A by PCR: NEGATIVE
Influenza B by PCR: NEGATIVE
SARS Coronavirus 2 by RT PCR: NEGATIVE

## 2020-09-20 LAB — TROPONIN I (HIGH SENSITIVITY)
Troponin I (High Sensitivity): 31 ng/L — ABNORMAL HIGH (ref <18)
Troponin I (High Sensitivity): 33 ng/L — ABNORMAL HIGH (ref <18)

## 2020-09-20 NOTE — Discharge Instructions (Addendum)
As we discussed please discontinue use of your phentermine medication until you have discussed this with your doctor.  Return to the emergency department for any chest pain, palpitations/heart racing, shortness of breath or any other symptom personally concerning to yourself.  Please follow-up with your doctor in the next 24 to 48 hours for recheck/reevaluation.

## 2020-09-20 NOTE — ED Triage Notes (Signed)
Pt reports that he was playing golf last night and his heart rate was high. He was sweaty during that time. He denies any chest pain.

## 2020-09-20 NOTE — ED Provider Notes (Signed)
[facility] Emergency Department Provider Note  Time seen: 9:52 PM  I have reviewed the triage vital signs and the nursing notes.   HISTORY  Chief Complaint Palpitations and Torticollis   HPI CLAUDY SOTO is a 65 y.o. male with a past medical history of gastric reflux, hypertension, presents to the emergency department for heart palpitations.  According to the patient earlier today he was playing golf when he began feeling his heart race, was feeling palpitations and became somewhat sweaty and nauseous.  Patient states symptoms did not last too long, denies any chest pain at any point.  Denies any shortness of breath or cough.  No recent fever.  Patient states he was recently started on a new diet medication by his physician, record review shows this medication is phentermine.  Currently patient appears well, heart rate around 80 bpm.  Denies any symptoms.  States he is feeling much better.  Did have an episode of vomiting just prior to arrival.   Past Medical History:  Diagnosis Date   GERD (gastroesophageal reflux disease)    OCC-NO MEDS   Hypertension    Left bundle branch block (LBBB)    ON EKG FROM 06-2015    There are no problems to display for this patient.   Past Surgical History:  Procedure Laterality Date   BLEPHAROPLASTY Bilateral 2014   CARDIAC CATHETERIZATION  1998   normal   CATARACT EXTRACTION W/ INTRAOCULAR LENS  IMPLANT, BILATERAL  2014   COLONOSCOPY  2008   KNEE ARTHROSCOPY Right 2000   SHOULDER SURGERY Left    UMBILICAL HERNIA REPAIR N/A 10/10/2015   Procedure: HERNIA REPAIR UMBILICAL ADULT with mesh;  Surgeon: Leonie Green, MD;  Location: [facility] ORS;  Service: General;  Laterality: N/A;    Prior to Admission medications   Medication Sig Start Date End Date Taking? Authorizing Provider  ascorbic acid (VITAMIN C) 1000 MG tablet Take by mouth.    [provider]  benzonatate (TESSALON) 200 MG capsule Take 1 capsule (200  mg total) by mouth 2 (two) times daily as needed for cough. 01/27/20   Rodriguez-Southworth, Sunday Spillers, PA-C  clotrimazole-betamethasone (LOTRISONE) cream Apply topically. 07/14/19   [provider]  lisinopril-hydrochlorothiazide (PRINZIDE,ZESTORETIC) 10-12.5 MG per tablet Take 1 tablet by mouth daily.    [provider]  Misc Natural Products (OSTEO BI-FLEX/5-LOXIN ADVANCED PO) Take by mouth.    [provider]  OVER THE COUNTER MEDICATION Vitamin D 3, one capsule daily.    [provider]  OVER THE COUNTER MEDICATION Zinc one capsule daily.    [provider]  tadalafil (CIALIS) 5 MG tablet Take by mouth. 08/26/19 08/25/20  [provider]    No Known Allergies  Family History  Problem Relation Age of Onset   Stomach cancer Father    Dementia Mother    Colon cancer Neg Hx    Esophageal cancer Neg Hx    Rectal cancer Neg Hx     Social History Social History   Tobacco Use   Smoking status: Former    Packs/day: 2.00    Years: 20.00    Pack years: 40.00    Types: Cigarettes   Smokeless tobacco: Never  Vaping Use   Vaping Use: Former  Substance Use Topics   Alcohol use: Yes    Alcohol/week: 4.0 standard drinks    Types: 4 Cans of beer per week    Comment: OCC   Drug use: No    Review  of Systems Constitutional: Negative for fever. Cardiovascular: Heart palpitations, now resolved. Respiratory: Negative for shortness of breath. Gastrointestinal: Negative for abdominal pain.  Nausea, vomit x1 Musculoskeletal: Negative for musculoskeletal complaints Neurological: Negative for headache All other ROS negative  ____________________________________________   PHYSICAL EXAM:  VITAL SIGNS: ED Triage Vitals  Enc Vitals Group     BP 09/20/20 1707 122/79     Pulse Rate 09/20/20 1707 (!) 111     Resp 09/20/20 1707 16     Temp 09/20/20 1707 98.7 F (37.1 C)     Temp Source 09/20/20 1707 Oral     SpO2 09/20/20 1707 96 %      Weight 09/20/20 1708 275 lb (124.7 kg)     Height 09/20/20 1708 6' (1.829 m)     Head Circumference --      Peak Flow --      Pain Score 09/20/20 1708 0     Pain Loc --      Pain Edu? --      Excl. in [place]? --    Constitutional: Alert and oriented. Well appearing and in no distress. Eyes: Normal exam ENT      Head: Normocephalic and atraumatic.      Mouth/Throat: Mucous membranes are moist. Cardiovascular: Normal rate, regular rhythm.  Respiratory: Normal respiratory effort without tachypnea nor retractions. Breath sounds are clear  Gastrointestinal: Soft and nontender. No distention.   Musculoskeletal: Nontender with normal range of motion in all extremities. Neurologic:  Normal speech and language. No gross focal neurologic deficits Skin:  Skin is warm, dry and intact.  Psychiatric: Mood and affect are normal.   ____________________________________________    EKG  EKG viewed and interpreted by myself shows sinus tachycardia 115 bpm with a slightly widened QRS, normal axis, slight QTC prolongation otherwise normal intervals and nonspecific ST changes.  Left bundle branch block is known to the patient.  ____________________________________________    RADIOLOGY  Chest x-ray is negative  ____________________________________________   INITIAL IMPRESSION / ASSESSMENT AND PLAN / ED COURSE  Pertinent labs & imaging results that were available during my care of the patient were reviewed by me and considered in my medical decision making (see chart for details).   Patient presents emergency department for heart palpitations/racing along with nausea diaphoresis and vomit x1 that occurred earlier today while playing golf.  Patient states his symptoms have since resolved.  Denies any symptoms currently.  Appears well pulse rate of 80.  Patient states he was recently started on a diet pill by his doctor, record review shows this was phentermine.  Patient states he is supposed to take a  half a tablet x2 weeks then increase to a full tablet.  States he took a half a tablet x1 week and then forgot it when he went on vacation for a week.  Took a full dose on Monday, forgot to take it yesterday until the evening took it yesterday evening and then took it again this morning and then around noon today is when symptoms occurred.  Patient very likely could be having a medication reaction as this medication was phentermine.  Patient's initial troponin is slightly elevated however repeat troponin is largely unchanged.  Heart rate is normal around 80 bpm currently.  And the patient feels well.  We will check a COVID swab as a precaution, remainder of the lab work is pending.  If COVID is negative anticipate likely discharge home with PCP follow-up.  We will recommend the patient discontinue  use of the phentermine diet pills.  COVID is negative.  Lab work largely reassuring.  Highly suspect patient's phentermine to be the cause of his palpitations today.  Repeat troponin largely unchanged from initial.  We will discharge the patient home.  Have instructed the patient to discontinue use of phentermine until he can speak to his doctor.  Discussed return precautions.  Patient agreeable to plan of care.  RUSSELL HOUSMAN was evaluated in Emergency Department on 09/20/2020 for the symptoms described in the history of present illness. He was evaluated in the context of the global COVID-19 pandemic, which necessitated consideration that the patient might be at risk for infection with the SARS-CoV-2 virus that causes COVID-19. Institutional protocols and algorithms that pertain to the evaluation of patients at risk for COVID-19 are in a state of rapid change based on information released by regulatory bodies including the CDC and federal and state organizations. These policies and algorithms were followed during the patient's care in the ED.  ____________________________________________   FINAL CLINICAL  IMPRESSION(S) / ED DIAGNOSES  Palpitations   Harvest Dark, MD 09/20/20 2253

## 2020-09-21 NOTE — ED Notes (Signed)
Went in to give pt discharge papers and pt not in room

## 2020-12-11 ENCOUNTER — Emergency Department
Admission: EM | Admit: 2020-12-11 | Discharge: 2020-12-11 | Disposition: A | Discharge status: Home / Self Care | Payer: BC Managed Care – PPO | Attending: Student in an Organized Health Care Education/Training Program | Admitting: Student in an Organized Health Care Education/Training Program

## 2020-12-11 ENCOUNTER — Emergency Department: Payer: BC Managed Care – PPO

## 2020-12-11 ENCOUNTER — Other Ambulatory Visit: Payer: Self-pay

## 2020-12-11 DIAGNOSIS — R059 Cough, unspecified: Secondary | ICD-10-CM | POA: Diagnosis present

## 2020-12-11 DIAGNOSIS — Z87891 Personal history of nicotine dependence: Secondary | ICD-10-CM | POA: Insufficient documentation

## 2020-12-11 DIAGNOSIS — Z79899 Other long term (current) drug therapy: Secondary | ICD-10-CM | POA: Insufficient documentation

## 2020-12-11 DIAGNOSIS — I10 Essential (primary) hypertension: Secondary | ICD-10-CM | POA: Diagnosis not present

## 2020-12-11 DIAGNOSIS — J101 Influenza due to other identified influenza virus with other respiratory manifestations: Secondary | ICD-10-CM | POA: Diagnosis not present

## 2020-12-11 DIAGNOSIS — Z20822 Contact with and (suspected) exposure to covid-19: Secondary | ICD-10-CM | POA: Diagnosis not present

## 2020-12-11 DIAGNOSIS — R1013 Epigastric pain: Secondary | ICD-10-CM | POA: Diagnosis not present

## 2020-12-11 LAB — CBC
HCT: 42.1 % (ref 39.0–52.0)
Hemoglobin: 14.1 g/dL (ref 13.0–17.0)
MCH: 29 pg (ref 26.0–34.0)
MCHC: 33.5 g/dL (ref 30.0–36.0)
MCV: 86.4 fL (ref 80.0–100.0)
Platelets: 209 10*3/uL (ref 150–400)
RBC: 4.87 MIL/uL (ref 4.22–5.81)
RDW: 13.8 % (ref 11.5–15.5)
WBC: 7.3 10*3/uL (ref 4.0–10.5)
nRBC: 0 % (ref 0.0–0.2)

## 2020-12-11 LAB — COMPREHENSIVE METABOLIC PANEL
ALT: 49 U/L — ABNORMAL HIGH (ref 0–44)
AST: 37 U/L (ref 15–41)
Albumin: 4.2 g/dL (ref 3.5–5.0)
Alkaline Phosphatase: 67 U/L (ref 38–126)
Anion gap: 10 (ref 5–15)
BUN: 13 mg/dL (ref 8–23)
CO2: 25 mmol/L (ref 22–32)
Calcium: 9.1 mg/dL (ref 8.9–10.3)
Chloride: 99 mmol/L (ref 98–111)
Creatinine, Ser: 1.04 mg/dL (ref 0.61–1.24)
GFR, Estimated: >60 mL/min (ref >60)
Glucose, Bld: 121 mg/dL — ABNORMAL HIGH (ref 70–99)
Potassium: 3.7 mmol/L (ref 3.5–5.1)
Sodium: 134 mmol/L — ABNORMAL LOW (ref 135–145)
Total Bilirubin: 0.9 mg/dL (ref 0.3–1.2)
Total Protein: 7.5 g/dL (ref 6.5–8.1)

## 2020-12-11 LAB — URINALYSIS, ROUTINE W REFLEX MICROSCOPIC
Bilirubin Urine: NEGATIVE
Glucose, UA: NEGATIVE mg/dL
Hgb urine dipstick: NEGATIVE
Ketones, ur: NEGATIVE mg/dL
Leukocytes,Ua: NEGATIVE
Nitrite: NEGATIVE
Protein, ur: NEGATIVE mg/dL
Specific Gravity, Urine: 1.018 (ref 1.005–1.030)
pH: 5 (ref 5.0–8.0)

## 2020-12-11 LAB — RESP PANEL BY RT-PCR (FLU A&B, COVID) ARPGX2
Influenza A by PCR: POSITIVE — AB
Influenza B by PCR: NEGATIVE
SARS Coronavirus 2 by RT PCR: NEGATIVE

## 2020-12-11 LAB — LIPASE, BLOOD: Lipase: 30 U/L (ref 11–51)

## 2020-12-11 LAB — TROPONIN I (HIGH SENSITIVITY): Troponin I (High Sensitivity): 13 ng/L (ref <18)

## 2020-12-11 MED ORDER — IBUPROFEN 600 MG PO TABS
600.0000 mg | ORAL_TABLET | Freq: Once | ORAL | Status: AC
  Administered 2020-12-11: 600 mg via ORAL
  Filled 2020-12-11: qty 1

## 2020-12-11 MED ORDER — OSELTAMIVIR PHOSPHATE 75 MG PO CAPS: 75.0000 mg | ORAL_CAPSULE | Freq: Two times a day (BID) | ORAL | 0 refills | Status: AC

## 2020-12-11 NOTE — ED Notes (Signed)
Vladimir Crofts, NP at bedside at this time.

## 2020-12-11 NOTE — ED Triage Notes (Signed)
First Nurse Note:  Arrives via [facility].  C/O CP x 1 day.  Also c/O cough and fever. Temp:  99.5.   137/67 81- P 100% - SpO2 CBG:  116

## 2020-12-11 NOTE — ED Notes (Signed)
X-Ray at bedside.

## 2020-12-11 NOTE — ED Provider Notes (Signed)
[facility] Emergency Department Provider Note ____________________________________________   Event Date/Time   First MD Initiated Contact with Patient 12/11/20 1329     (approximate)  I have reviewed the triage vital signs and the nursing notes.   HISTORY  Chief Complaint Fever, Cough, and Abdominal Pain  HPI Ryan Thomas is a 65 y.o. male with history of hypertension, left bundle branch block, and remaining history as listed below presents to the emergency department for treatment and evaluation after he started feeling bad yesterday evening.  States that he was having cold chills and a cough.  He took a COVID test this morning and that was negative.  Later this morning, he developed epigastric pain.  No nausea or vomiting.         Past Medical History:  Diagnosis Date   GERD (gastroesophageal reflux disease)    OCC-NO MEDS   Hypertension    Left bundle branch block (LBBB)    ON EKG FROM 06-2015    There are no problems to display for this patient.   Past Surgical History:  Procedure Laterality Date   BLEPHAROPLASTY Bilateral 2014   CARDIAC CATHETERIZATION  1998   normal   CATARACT EXTRACTION W/ INTRAOCULAR LENS  IMPLANT, BILATERAL  2014   COLONOSCOPY  2008   KNEE ARTHROSCOPY Right 2000   SHOULDER SURGERY Left    UMBILICAL HERNIA REPAIR N/A 10/10/2015   Procedure: HERNIA REPAIR UMBILICAL ADULT with mesh;  Surgeon: Leonie Green, MD;  Location: [facility] ORS;  Service: General;  Laterality: N/A;    Prior to Admission medications   Medication Sig Start Date End Date Taking? Authorizing Provider  oseltamivir (TAMIFLU) 75 MG capsule Take 1 capsule (75 mg total) by mouth 2 (two) times daily for 10 days. 12/11/20 12/21/20 Yes Kyen Taite B, FNP  ascorbic acid (VITAMIN C) 1000 MG tablet Take by mouth.    [provider]  benzonatate (TESSALON) 200 MG capsule Take 1 capsule (200 mg total) by mouth 2 (two) times daily as needed for  cough. 01/27/20   Rodriguez-Southworth, Sunday Spillers, PA-C  clotrimazole-betamethasone (LOTRISONE) cream Apply topically. 07/14/19   [provider]  lisinopril-hydrochlorothiazide (PRINZIDE,ZESTORETIC) 10-12.5 MG per tablet Take 1 tablet by mouth daily.    [provider]  Misc Natural Products (OSTEO BI-FLEX/5-LOXIN ADVANCED PO) Take by mouth.    [provider]  OVER THE COUNTER MEDICATION Vitamin D 3, one capsule daily.    [provider]  OVER THE COUNTER MEDICATION Zinc one capsule daily.    [provider]  tadalafil (CIALIS) 5 MG tablet Take by mouth. 08/26/19 08/25/20  [provider]    Allergies Patient has no known allergies.  Family History  Problem Relation Age of Onset   Stomach cancer Father    Dementia Mother    Colon cancer Neg Hx    Esophageal cancer Neg Hx    Rectal cancer Neg Hx     Social History Social History   Tobacco Use   Smoking status: Former    Packs/day: 2.00    Years: 20.00    Pack years: 40.00    Types: Cigarettes   Smokeless tobacco: Never  Vaping Use   Vaping Use: Former  Substance Use Topics   Alcohol use: Yes    Alcohol/week: 4.0 standard drinks    Types: 4 Cans of beer per week    Comment: OCC   Drug use: No    Review of Systems  Constitutional: No fever/chills  Eyes: No visual changes. ENT: No sore throat. Cardiovascular: Denies chest pain. Respiratory: Denies shortness of breath. Gastrointestinal: No abdominal pain.  No nausea, no vomiting.  No diarrhea.  No constipation. Genitourinary: Positive for epigastric pain. Musculoskeletal: Negative for back pain. Skin: Negative for rash. Neurological: Negative for headaches, focal weakness or numbness.  ____________________________________________   PHYSICAL EXAM:  VITAL SIGNS: ED Triage Vitals [12/11/20 1254]  Enc Vitals Group     BP (!) 143/85     Pulse Rate 91     Resp 18     Temp (!) 100.5 F (38.1 C)     Temp Source  Oral     SpO2 94 %     Weight 289 lb (131.1 kg)     Height 6' (1.829 m)     Head Circumference      Peak Flow      Pain Score 4     Pain Loc      Pain Edu?      Excl. in [place]?     Constitutional: Alert and oriented. Well appearing and in no acute distress. Eyes: Conjunctivae are normal. Head: Atraumatic. Nose: No congestion/rhinnorhea. Mouth/Throat: Mucous membranes are moist. Oropharynx non-erythematous. Neck: No stridor.   Hematological/Lymphatic/Immunilogical: No cervical lymphadenopathy. Cardiovascular: Normal rate, regular rhythm. Grossly normal heart sounds. Good peripheral circulation. Respiratory: Normal respiratory effort.  No retractions. Lungs CTAB. Gastrointestinal: Soft and nontender. No distention. No abdominal bruits. Musculoskeletal: No lower extremity tenderness nor edema.  No joint effusions. Neurologic:  Normal speech and language. No gross focal neurologic deficits are appreciated. No gait instability. Skin:  Skin is warm, dry and intact. No rash noted. Psychiatric: Mood and affect are normal. Speech and behavior are normal.  ____________________________________________   LABS (all labs ordered are listed, but only abnormal results are displayed)  Labs Reviewed  RESP PANEL BY RT-PCR (FLU A&B, COVID) ARPGX2 - Abnormal; Notable for the following components:      Result Value   Influenza A by PCR POSITIVE (*)    All other components within normal limits  COMPREHENSIVE METABOLIC PANEL - Abnormal; Notable for the following components:   Sodium 134 (*)    Glucose, Bld 121 (*)    ALT 49 (*)    All other components within normal limits  URINALYSIS, ROUTINE W REFLEX MICROSCOPIC - Abnormal; Notable for the following components:   Color, Urine YELLOW (*)    APPearance CLEAR (*)    All other components within normal limits  LIPASE, BLOOD  CBC  TROPONIN I (HIGH SENSITIVITY)  TROPONIN I (HIGH SENSITIVITY)    ____________________________________________  EKG  ED ECG REPORT I, Kendre Jacinto, FNP-BC personally viewed and interpreted this ECG.   Date: 12/11/2020  EKG Time: 1300  Rate: 90  Rhythm: unchanged from previous tracings, normal sinus rhythm, LBBB  Axis: normal  Intervals:left bundle branch block  ST&T Change: no ST elevation  ____________________________________________  RADIOLOGY  ED MD interpretation:    Chest x-ray negative for acute cardiopulmonary abnormality. I, Sherrie George, personally viewed and evaluated these images (plain radiographs) as part of my medical decision making, as well as reviewing the written report by the radiologist.  Official radiology report(s): DG Chest 1 View  Result Date: 12/11/2020 CLINICAL DATA:  Fever with epigastric chest pain for 1 day. EXAM: CHEST  1 VIEW COMPARISON:  Radiographs 09/20/2020 and 01/27/2020. FINDINGS: 1400 hours. Two views obtained. Suboptimal inspiration. The heart size and mediastinal contours are stable. The lungs appear clear. There is no pleural  effusion or pneumothorax. No acute fractures are identified. There are probable postsurgical changes at the distal left clavicle with right acromioclavicular degenerative changes and multilevel thoracic spondylosis. IMPRESSION: No evidence of acute cardiopulmonary process. Electronically Signed   By: Richardean Sale M.D.   On: 12/11/2020 14:30    ____________________________________________   PROCEDURES  Procedure(s) performed (including Critical Care):  Procedures  ____________________________________________   INITIAL IMPRESSION / ASSESSMENT AND PLAN     66 year old male presenting to the emergency department for treatment and evaluation of general malaise and epigastric pain.  See HPI for further details.  Plan will be to get labs, EKG, chest x-ray, and test for COVID and influenza.  DIFFERENTIAL DIAGNOSIS  COVID, influenza, viral syndrome, GERD, cardiac  event  ED COURSE  Respiratory panel shows positive for influenza A.  EKG is unchanged from previous.  Troponin is normal.  All other labs and the urinalysis is unremarkable.  Plan will be to treat with Tamiflu and have the patient take Tylenol ibuprofen for pain or fever.  He was encouraged to stay well-hydrated.  For symptoms that are not improving over the week he is to follow-up with his primary care provider.  If symptoms change or worsen and is unable to schedule appointment he is to return to the emergency department.     As part of my medical decision making, I reviewed the following data within the [place] History obtained from family and Old EKG reviewed  ___________________________________________   FINAL CLINICAL IMPRESSION(S) / ED DIAGNOSES  Final diagnoses:  Influenza A     ED Discharge Orders          Ordered    oseltamivir (TAMIFLU) 75 MG capsule  2 times daily        12/11/20 1508             Ryan Thomas was evaluated in Emergency Department on 12/11/2020 for the symptoms described in the history of present illness. He was evaluated in the context of the global COVID-19 pandemic, which necessitated consideration that the patient might be at risk for infection with the SARS-CoV-2 virus that causes COVID-19. Institutional protocols and algorithms that pertain to the evaluation of patients at risk for COVID-19 are in a state of rapid change based on information released by regulatory bodies including the CDC and federal and state organizations. These policies and algorithms were followed during the patient's care in the ED.   Note:  This document was prepared using Dragon voice recognition software and may include unintentional dictation errors.    Victorino Dike, FNP 12/11/20 1707    Lucrezia Starch, MD 12/11/20 2133

## 2020-12-11 NOTE — ED Triage Notes (Signed)
Pt states that he started feeling bad last night, states he was feeling cold and had a cough pt reports he took a covid test at home that was negative, pt went out for an errand and came home this morning feeling much worse and having epigastric pain, pt arrives via [facility]

## 2020-12-11 NOTE — Discharge Instructions (Signed)
Keep well hydrated. Follow up with primary care if not improving over the week. Stay home until you are fever free for 24 hours without medication.

## 2021-03-07 IMAGING — US US SCROTUM W/ DOPPLER COMPLETE
1 series · 15 of 25 positions shown · non-contrast
Comparison: None
COMPARISON: None
COMPARISON: None
COMPARISON: None

Addendum:
CLINICAL DATA: BILATERAL testicular pain for 3-4 weeks

EXAM:
SCROTAL ULTRASOUND
DOPPLER ULTRASOUND OF THE TESTICLES
TECHNIQUE: Complete ultrasound examination of the testicles, epididymis, and
other scrotal structures was performed. Color and spectral Doppler
ultrasound were also utilized to evaluate blood flow to the
testicles.

[Series 1: us scrotum w/doppler · 15 of 124 slices shown]
[im 1/124]
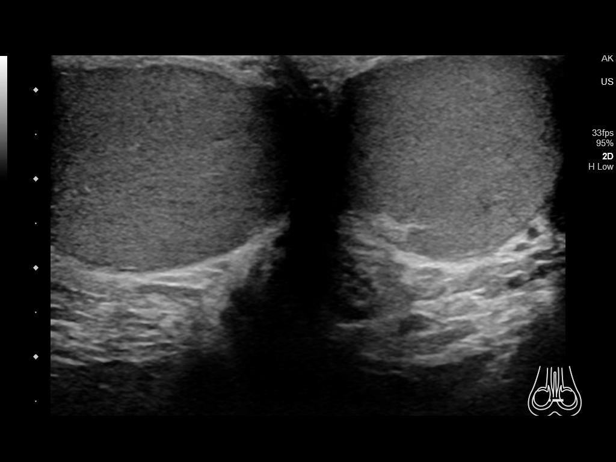
[im 11/124]
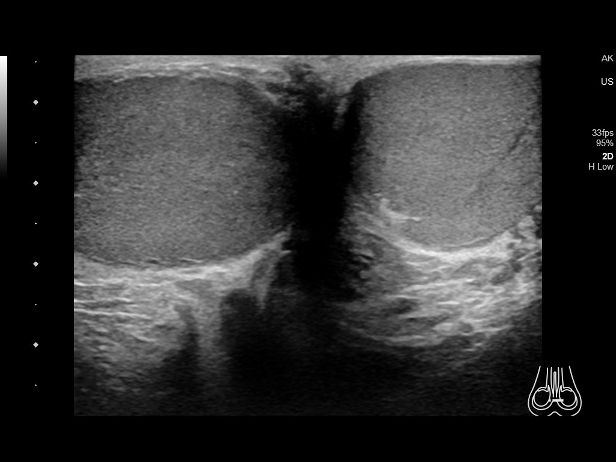
[im 21/124]
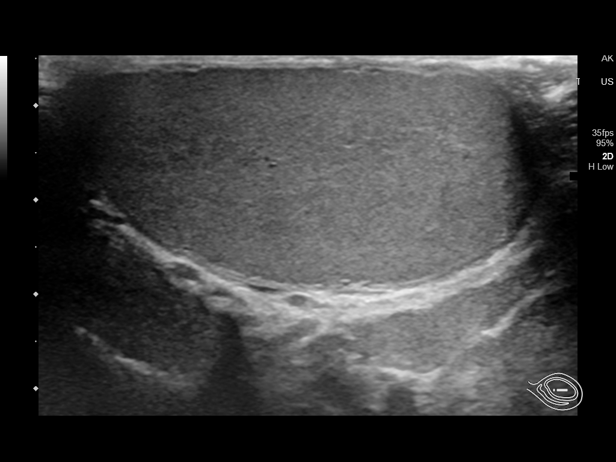
[im 26/124]
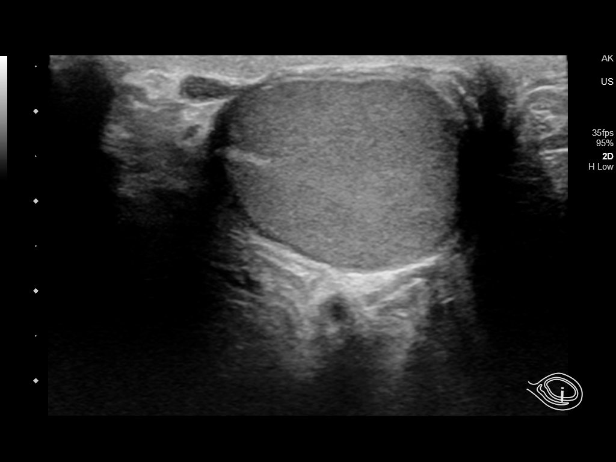
[im 36/124]
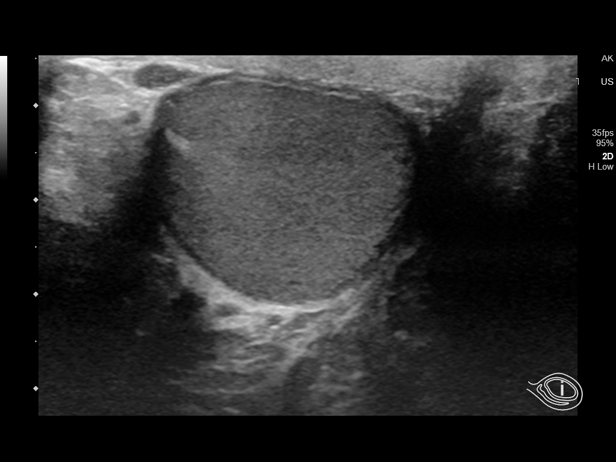
[im 47/124]
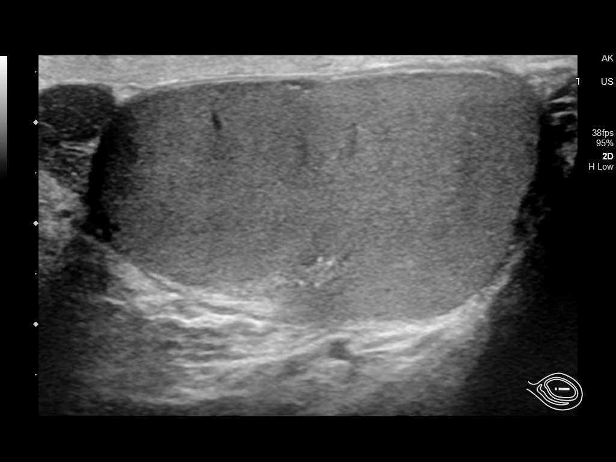
[im 52/124]
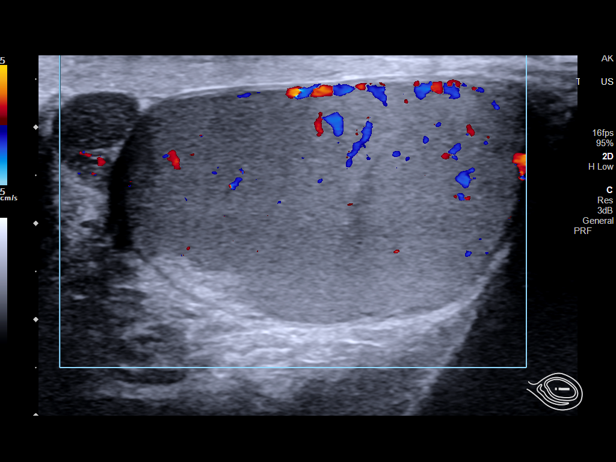
[im 62/124]
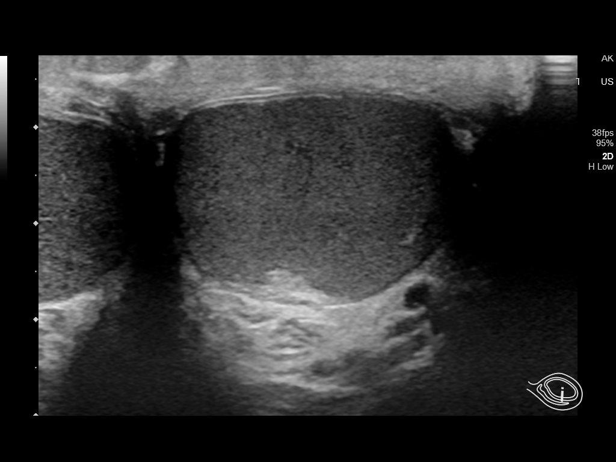
[im 72/124]
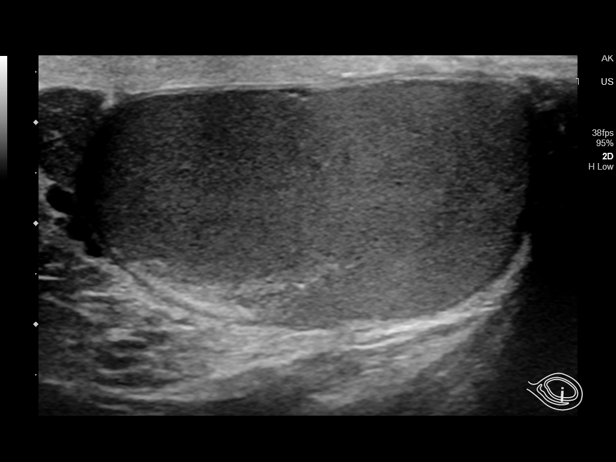
[im 77/124]
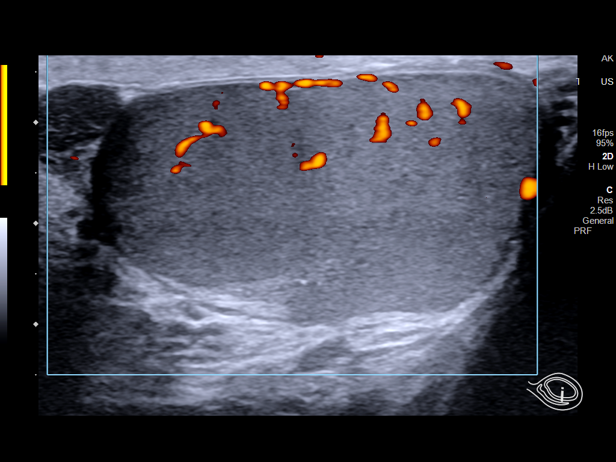
[im 88/124]
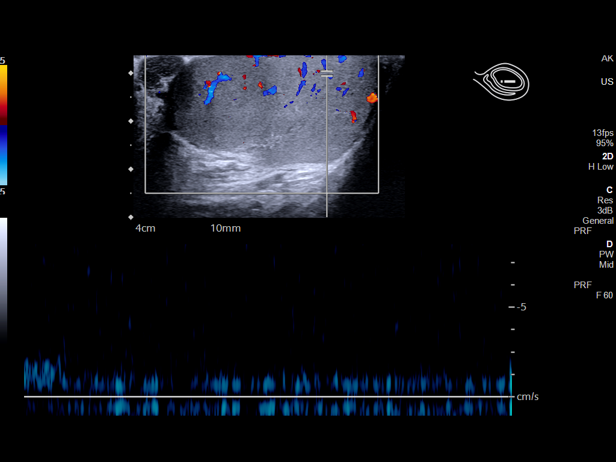
[im 98/124]
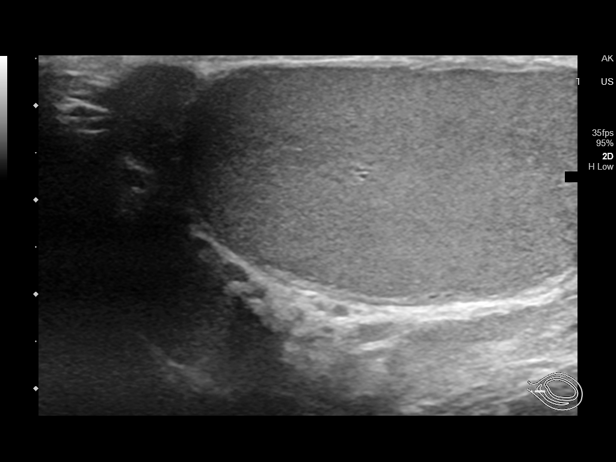
[im 103/124]
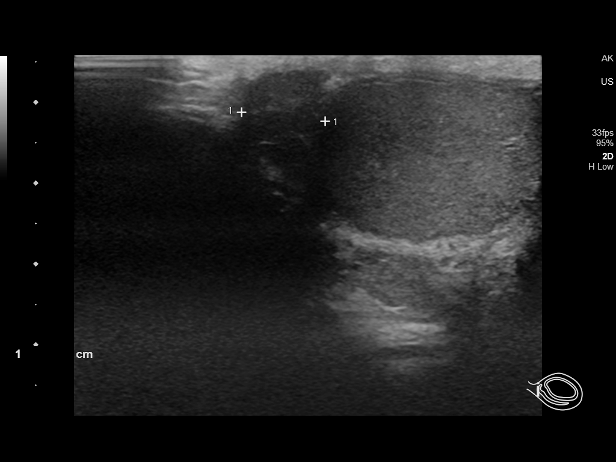
[im 113/124]
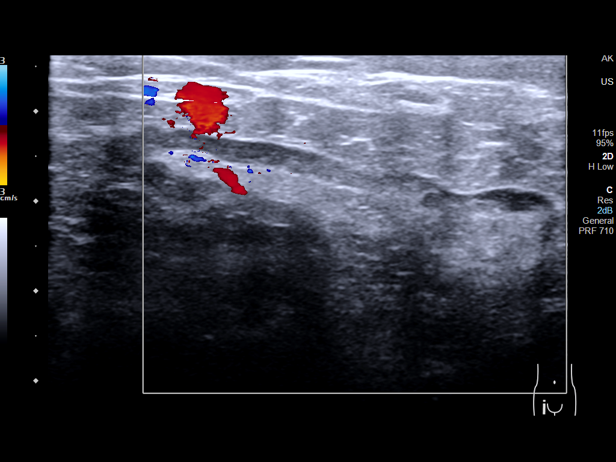
[im 124/124]
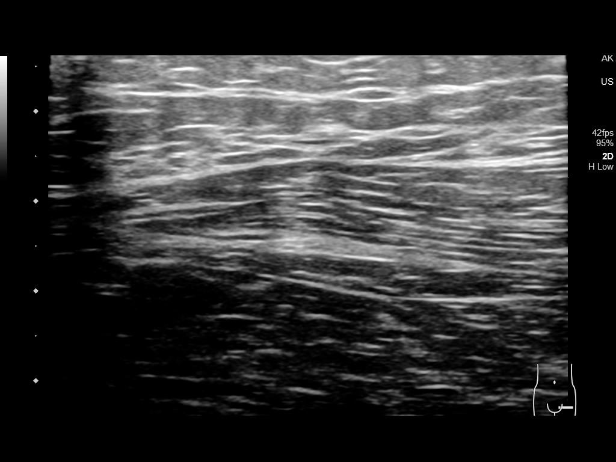

[15 of 25 positions shown; findings below may reference images not displayed]

FINDINGS: Right testicle

Measurements: 4.7 x 2.4 x 2.5 cm. Normal echogenicity without mass
or calcification. Internal blood flow present on color Doppler
imaging.

Left testicle

Measurements: 4.4 x 2.5 x 3.0 cm. Normal echogenicity without mass
or calcification. Internal blood flow present on color Doppler
imaging, symmetric with RIGHT

Right epididymis:  Normal in size and appearance.

Left epididymis:  Normal in size and appearance.

Hydrocele:  None visualized.

Varicocele:  None visualized.

Pulsed Doppler interrogation of both testes demonstrates normal low
resistance arterial and venous waveforms bilaterally.
IMPRESSION: Normal exam.

ADDENDUM:
By request, images were reviewed.

Normal sized lymph nodes identified at RIGHT inguinal region.

No RIGHT inguinal hernia identified.

Upon further review of images, an ill-defined area of
hypoechogenicity is seen at the upper pole of the LEFT testis. This
is felt to represent artifact from overlying soft tissues. However
we will recall the patient to confirm artifact and exclude subtle
LEFT testicular abnormality.

ADDENDUM:
The patient returned for additional imaging because of slight
decreased echogenicity of the superior aspect of the left testicle
on initial imaging. This was felt to be artifactual but we asked the
patient to return for confirmation. The patient did return for
additional imaging which confirms that the appearance on the initial
scan was due to transducer position artifact.

The left testicle is normal.

ADDENDUM:
Upon further review of the initial study performed on 05/14/2019 the
left inguinal region was examined and there is no evidence of
adenopathy or of a hernia in the region of the left inguinal canal.

As Dr. Lienad reported previously, there are small benign reactive
lymph nodes in the right inguinal region but there is no right
inguinal hernia.
IMPRESSION: No evidence of right or left inguinal hernia on the ultrasound exam
of 05/14/2019. Small reactive lymph nodes in the right inguinal
region. No visible lymph nodes in the left inguinal region.

*** End of Addendum ***
Addendum:
FINDINGS: Right testicle

Measurements: 4.7 x 2.4 x 2.5 cm. Normal echogenicity without mass
or calcification. Internal blood flow present on color Doppler
imaging.

Left testicle

Measurements: 4.4 x 2.5 x 3.0 cm. Normal echogenicity without mass
or calcification. Internal blood flow present on color Doppler
imaging, symmetric with RIGHT

Right epididymis:  Normal in size and appearance.

Left epididymis:  Normal in size and appearance.

Hydrocele:  None visualized.

Varicocele:  None visualized.

Pulsed Doppler interrogation of both testes demonstrates normal low
resistance arterial and venous waveforms bilaterally.
IMPRESSION: Normal exam.

ADDENDUM:
By request, images were reviewed.

Normal sized lymph nodes identified at RIGHT inguinal region.

No RIGHT inguinal hernia identified.

Upon further review of images, an ill-defined area of
hypoechogenicity is seen at the upper pole of the LEFT testis. This
is felt to represent artifact from overlying soft tissues. However
we will recall the patient to confirm artifact and exclude subtle
LEFT testicular abnormality.

ADDENDUM:
The patient returned for additional imaging because of slight
decreased echogenicity of the superior aspect of the left testicle
on initial imaging. This was felt to be artifactual but we asked the
patient to return for confirmation. The patient did return for
additional imaging which confirms that the appearance on the initial
scan was due to transducer position artifact.

The left testicle is normal.

*** End of Addendum ***
Addendum:
FINDINGS: Right testicle

Measurements: 4.7 x 2.4 x 2.5 cm. Normal echogenicity without mass
or calcification. Internal blood flow present on color Doppler
imaging.

Left testicle

Measurements: 4.4 x 2.5 x 3.0 cm. Normal echogenicity without mass
or calcification. Internal blood flow present on color Doppler
imaging, symmetric with RIGHT

Right epididymis:  Normal in size and appearance.

Left epididymis:  Normal in size and appearance.

Hydrocele:  None visualized.

Varicocele:  None visualized.

Pulsed Doppler interrogation of both testes demonstrates normal low
resistance arterial and venous waveforms bilaterally.
IMPRESSION: Normal exam.

ADDENDUM:
By request, images were reviewed.

Normal sized lymph nodes identified at RIGHT inguinal region.

No RIGHT inguinal hernia identified.

Upon further review of images, an ill-defined area of
hypoechogenicity is seen at the upper pole of the LEFT testis. This
is felt to represent artifact from overlying soft tissues. However
we will recall the patient to confirm artifact and exclude subtle
LEFT testicular abnormality.

*** End of Addendum ***
FINDINGS: Right testicle

Measurements: 4.7 x 2.4 x 2.5 cm. Normal echogenicity without mass
or calcification. Internal blood flow present on color Doppler
imaging.

Left testicle

Measurements: 4.4 x 2.5 x 3.0 cm. Normal echogenicity without mass
or calcification. Internal blood flow present on color Doppler
imaging, symmetric with RIGHT

Right epididymis:  Normal in size and appearance.

Left epididymis:  Normal in size and appearance.

Hydrocele:  None visualized.

Varicocele:  None visualized.

Pulsed Doppler interrogation of both testes demonstrates normal low
resistance arterial and venous waveforms bilaterally.
IMPRESSION: Normal exam.

## 2021-04-17 ENCOUNTER — Other Ambulatory Visit: Payer: Self-pay | Admitting: Family Medicine

## 2021-04-17 DIAGNOSIS — M5416 Radiculopathy, lumbar region: Secondary | ICD-10-CM

## 2021-04-25 ENCOUNTER — Inpatient Hospital Stay: Admission: RE | Admit: 2021-04-25 | Payer: BC Managed Care – PPO | Source: Ambulatory Visit

## 2021-05-04 ENCOUNTER — Ambulatory Visit
Admission: RE | Admit: 2021-05-04 | Discharge: 2021-05-04 | Disposition: A | Discharge status: Home / Self Care | Payer: BC Managed Care – PPO | Source: Ambulatory Visit | Attending: Family Medicine | Admitting: Family Medicine

## 2021-05-04 DIAGNOSIS — M5416 Radiculopathy, lumbar region: Secondary | ICD-10-CM

## 2021-12-25 ENCOUNTER — Encounter: Payer: Self-pay | Admitting: Internal Medicine

## 2022-01-16 ENCOUNTER — Ambulatory Visit (AMBULATORY_SURGERY_CENTER): Payer: BC Managed Care – PPO

## 2022-01-16 VITALS — Ht 72.0 in | Wt 225.0 lb

## 2022-01-16 DIAGNOSIS — Z86010 Personal history of colonic polyps: Secondary | ICD-10-CM

## 2022-01-16 MED ORDER — NA SULFATE-K SULFATE-MG SULF 17.5-3.13-1.6 GM/177ML PO SOLN: 1.0000 | Freq: Once | ORAL | 0 refills | Status: AC

## 2022-01-16 NOTE — Progress Notes (Signed)
No egg or soy allergy known to patient  No issues known to pt with past sedation with any surgeries or procedures Patient denies ever being told they had issues or difficulty with intubation  No FH of Malignant Hyperthermia Pt is on Mounjaro and takes weekly injections on Saturdays.  May take injection on 12/31 or hold until after procedure. Pt is not on  home 02  Pt is not on blood thinners  Pt denies issues with constipation  No A fib or A flutter  Pt instructed to use [employer] or GoodRx for a price reduction on prep

## 2022-01-29 ENCOUNTER — Encounter: Payer: Self-pay | Admitting: Internal Medicine

## 2022-01-30 ENCOUNTER — Encounter: Payer: Self-pay | Admitting: Internal Medicine

## 2022-01-30 ENCOUNTER — Telehealth: Payer: Self-pay | Admitting: Internal Medicine

## 2022-01-30 NOTE — Telephone Encounter (Signed)
I spoke with pt earlier this am and emailed his instructions to him.

## 2022-01-30 NOTE — Telephone Encounter (Signed)
PT is calling to get prep instructions. Colonoscopy is on 1/5. He recently had eye surgery and cannot read them off My Chart. I offered to print off for him as well but he cannot drive. Please advise

## 2022-02-01 ENCOUNTER — Encounter: Payer: Self-pay | Admitting: Internal Medicine

## 2022-02-01 ENCOUNTER — Ambulatory Visit (AMBULATORY_SURGERY_CENTER): Payer: BC Managed Care – PPO | Admitting: Internal Medicine

## 2022-02-01 VITALS — BP 111/55 | HR 77 | Temp 99.6°F | Resp 13 | Ht 72.0 in | Wt 225.0 lb

## 2022-02-01 DIAGNOSIS — D125 Benign neoplasm of sigmoid colon: Secondary | ICD-10-CM

## 2022-02-01 DIAGNOSIS — D120 Benign neoplasm of cecum: Secondary | ICD-10-CM

## 2022-02-01 DIAGNOSIS — Z09 Encounter for follow-up examination after completed treatment for conditions other than malignant neoplasm: Secondary | ICD-10-CM | POA: Diagnosis not present

## 2022-02-01 DIAGNOSIS — D122 Benign neoplasm of ascending colon: Secondary | ICD-10-CM

## 2022-02-01 DIAGNOSIS — Z86010 Personal history of colonic polyps: Secondary | ICD-10-CM | POA: Diagnosis not present

## 2022-02-01 DIAGNOSIS — D123 Benign neoplasm of transverse colon: Secondary | ICD-10-CM

## 2022-02-01 MED ORDER — SODIUM CHLORIDE 0.9 % IV SOLN: 500.0000 mL | Freq: Once | INTRAVENOUS | Status: DC

## 2022-02-01 NOTE — Progress Notes (Signed)
Called to room to assist during endoscopic procedure.  Patient ID and intended procedure confirmed with present staff. Received instructions for my participation in the procedure from the performing physician.  

## 2022-02-01 NOTE — Progress Notes (Signed)
VS completed by DT.  Pt's states no medical or surgical changes since previsit or office visit.  

## 2022-02-01 NOTE — Op Note (Signed)
[facility] Patient Name: Ryan Thomas Procedure Date: 02/01/2022 9:10 AM MRN: [id number] Endoscopist: Jerene Bears , MD, [id number] Age: 67 Referring MD:  Date of Birth: 30-Oct-1955 Gender: Male Account #: [id number] Procedure:                Colonoscopy Indications:              High risk colon cancer surveillance: Personal                            history of multiple adenomas, Last colonoscopy:                            December 2020 (4 adenomas) Medicines:                Monitored Anesthesia Care Procedure:                Pre-Anesthesia Assessment:                           - Prior to the procedure, a History and Physical                            was performed, and patient medications and                            allergies were reviewed. The patient's tolerance of                            previous anesthesia was also reviewed. The risks                            and benefits of the procedure and the sedation                            options and risks were discussed with the patient.                            All questions were answered, and informed consent                            was obtained. Prior Anticoagulants: The patient has                            taken no anticoagulant or antiplatelet agents. ASA                            Grade Assessment: II - A patient with mild systemic                            disease. After reviewing the risks and benefits,                            the patient was deemed in satisfactory condition to  undergo the procedure.                           After obtaining informed consent, the colonoscope                            was passed under direct vision. Throughout the                            procedure, the patient's blood pressure, pulse, and                            oxygen saturations were monitored continuously. The                            CF [id number] #[id number] was introduced through  the anus                            and advanced to the cecum, identified by                            appendiceal orifice and ileocecal valve. The                            colonoscopy was performed without difficulty. The                            patient tolerated the procedure well. The quality                            of the bowel preparation was good. The ileocecal                            valve, appendiceal orifice, and rectum were                            photographed. Scope In: 9:20:40 AM Scope Out: 9:40:08 AM Scope Withdrawal Time: 0 hours 15 minutes 53 seconds  Total Procedure Duration: 0 hours 19 minutes 28 seconds  Findings:                 The digital rectal exam was normal.                           A 4 mm polyp was found in the cecum. The polyp was                            sessile. The polyp was removed with a cold snare.                            Resection and retrieval were complete.                           Three sessile polyps were found in the ascending  colon. The polyps were 3 to 5 mm in size. These                            polyps were removed with a cold snare. Resection                            and retrieval were complete.                           Two sessile polyps were found in the transverse                            colon. The polyps were 3 to 4 mm in size. These                            polyps were removed with a cold snare. Resection                            and retrieval were complete.                           A 4 mm polyp was found in the sigmoid colon. The                            polyp was sessile. The polyp was removed with a                            cold snare. Resection and retrieval were complete.                           Multiple medium-mouthed and small-mouthed                            diverticula were found in the sigmoid colon and                            distal descending colon.                            Internal hemorrhoids were found during                            retroflexion. The hemorrhoids were medium-sized. Complications:            No immediate complications. Estimated Blood Loss:     Estimated blood loss was minimal. Impression:               - One 4 mm polyp in the cecum, removed with a cold                            snare. Resected and retrieved.                           - Three 3 to 5 mm polyps in  the ascending colon,                            removed with a cold snare. Resected and retrieved.                           - Two 3 to 4 mm polyps in the transverse colon,                            removed with a cold snare. Resected and retrieved.                           - One 4 mm polyp in the sigmoid colon, removed with                            a cold snare. Resected and retrieved.                           - Mild diverticulosis in the sigmoid colon and in                            the distal descending colon.                           - Internal hemorrhoids. Recommendation:           - Patient has a contact number available for                            emergencies. The signs and symptoms of potential                            delayed complications were discussed with the                            patient. Return to normal activities tomorrow.                            Written discharge instructions were provided to the                            patient.                           - Resume previous diet.                           - Continue present medications.                           - Await pathology results.                           - Repeat colonoscopy is recommended for  surveillance. The colonoscopy date will be                            determined after pathology results from today's                            exam become available for review. Jerene Bears, MD 02/01/2022 9:43:23 AM This report has been signed  electronically.

## 2022-02-01 NOTE — Patient Instructions (Signed)
Patient has a contact number available for emergencies. The signs and symptoms of potential delayed complications were discussed with the patient. Return to normal activities tomorrow. Written discharge instructions were provided to the patient. - Resume previous diet. - Continue present medications. - Await pathology results. - Repeat colonoscopy is recommended for surveillance. The colonoscopy date will be determined after pathology results from today's exam become available for revi  YOU HAD AN ENDOSCOPIC PROCEDURE TODAY: Refer to the procedure report and other information in the discharge instructions given to you for any specific questions about what was found during the examination. If this information does not answer your questions, please call [place] office at [phone] to clarify.   YOU SHOULD EXPECT: Some feelings of bloating in the abdomen. Passage of more gas than usual. Walking can help get rid of the air that was put into your GI tract during the procedure and reduce the bloating. If you had a lower endoscopy (such as a colonoscopy or flexible sigmoidoscopy) you may notice spotting of blood in your stool or on the toilet paper. Some abdominal soreness may be present for a day or two, also.  DIET: Your first meal following the procedure should be a light meal and then it is ok to progress to your normal diet. A half-sandwich or bowl of soup is an example of a good first meal. Heavy or fried foods are harder to digest and may make you feel nauseous or bloated. Drink plenty of fluids but you should avoid alcoholic beverages for 24 hours. If you had a esophageal dilation, please see attached instructions for diet.    ACTIVITY: Your care partner should take you home directly after the procedure. You should plan to take it easy, moving slowly for the rest of the day. You can resume normal activity the day after the procedure however YOU SHOULD NOT DRIVE, use power tools, machinery or  perform tasks that involve climbing or major physical exertion for 24 hours (because of the sedation medicines used during the test).   SYMPTOMS TO REPORT IMMEDIATELY: A gastroenterologist can be reached at any hour. Please call [phone]  for any of the following symptoms:  Following lower endoscopy (colonoscopy, flexible sigmoidoscopy) Excessive amounts of blood in the stool  Significant tenderness, worsening of abdominal pains  Swelling of the abdomen that is new, acute  Fever of 100 or higher  Black, tarry-looking or red, bloody stools  FOLLOW UP:  If any biopsies were taken you will be contacted by phone or by letter within the next 1-3 weeks. Call [phone]  if you have not heard about the biopsies in 3 weeks.  Please also call with any specific questions about appointments or follow up tests.

## 2022-02-01 NOTE — Progress Notes (Signed)
Report to pacu rn. Vss. Care resumed by rn. 

## 2022-02-01 NOTE — Progress Notes (Signed)
GASTROENTEROLOGY PROCEDURE H&P NOTE   Primary Care Physician: Maryland Pink, MD    Reason for Procedure:  History of colon polyps  Plan:    Surveillance colonoscopy  Patient is appropriate for endoscopic procedure(s) in the ambulatory ([place]) setting.  The nature of the procedure, as well as the risks, benefits, and alternatives were carefully and thoroughly reviewed with the patient. Ample time for discussion and questions allowed. The patient understood, was satisfied, and agreed to proceed.     HPI: Ryan Thomas is a 68 y.o. male who presents for surveillance colonoscopy.  Medical history as below.  Tolerated the prep.  No recent chest pain or shortness of breath.  No abdominal pain today.  Past Medical History:  Diagnosis Date   GERD (gastroesophageal reflux disease)    OCC-NO MEDS   Hypertension    Left bundle branch block (LBBB)    ON EKG FROM 06-2015    Past Surgical History:  Procedure Laterality Date   BLEPHAROPLASTY Bilateral 2014   CARDIAC CATHETERIZATION  1998   normal   CATARACT EXTRACTION W/ INTRAOCULAR LENS  IMPLANT, BILATERAL  2014   COLONOSCOPY  2008   KNEE ARTHROSCOPY Right 2000   SHOULDER SURGERY Left    UMBILICAL HERNIA REPAIR N/A 10/10/2015   Procedure: HERNIA REPAIR UMBILICAL ADULT with mesh;  Surgeon: Leonie Green, MD;  Location: [facility] ORS;  Service: General;  Laterality: N/A;    Prior to Admission medications   Medication Sig Start Date End Date Taking? Authorizing Provider  ascorbic acid (VITAMIN C) 1000 MG tablet Take by mouth.   Yes [provider]  cyanocobalamin (VITAMIN B12) 1000 MCG tablet Take 1,000 mcg by mouth.   Yes [provider]  erythromycin ophthalmic ointment Apply to incision sites three times a day during the day and once at bedtime 01/23/22  Yes [provider]  lisinopril-hydrochlorothiazide (PRINZIDE,ZESTORETIC) 10-12.5 MG per tablet Take 1 tablet by mouth daily.   Yes [provider]  neomycin-polymyxin b-dexamethasone (MAXITROL) 3.5-10000-0.1 SUSP Administer 1 drop to both eyes Three (3) times a day for 14 days. 01/23/22 02/09/22 Yes [provider]  benzonatate (TESSALON) 200 MG capsule Take 1 capsule (200 mg total) by mouth 2 (two) times daily as needed for cough. Patient not taking: Reported on 01/16/2022 01/27/20   Rodriguez-Southworth, Sunday Spillers, PA-C  celecoxib (CELEBREX) 200 MG capsule Take 200 mg by mouth daily.    [provider]  clotrimazole-betamethasone (LOTRISONE) cream Apply topically. Patient not taking: Reported on 01/16/2022 07/14/19   [provider]  Misc Natural Products (OSTEO BI-FLEX/5-LOXIN ADVANCED PO) Take by mouth. Patient not taking: Reported on 01/16/2022    [provider]  MOUNJARO 7.5 MG/0.5ML Pen Inject 7.5 mg into the skin. 01/08/22   [provider]  OVER THE COUNTER MEDICATION Vitamin D 3, one capsule daily. Patient not taking: Reported on 01/16/2022    [provider]  OVER THE COUNTER MEDICATION Zinc one capsule daily. Patient not taking: Reported on 01/16/2022    [provider]  tadalafil (CIALIS) 5 MG tablet Take by mouth. 08/26/19 01/16/22  [provider]    Current Outpatient Medications  Medication Sig Dispense Refill   ascorbic acid (VITAMIN C) 1000 MG tablet Take by mouth.     cyanocobalamin (VITAMIN B12) 1000 MCG tablet Take 1,000 mcg by mouth.     erythromycin ophthalmic ointment Apply to incision sites three times a day during the day and once at bedtime  lisinopril-hydrochlorothiazide (PRINZIDE,ZESTORETIC) 10-12.5 MG per tablet Take 1 tablet by mouth daily.     neomycin-polymyxin b-dexamethasone (MAXITROL) 3.5-10000-0.1 SUSP Administer 1 drop to both eyes Three (3) times a day for 14 days.     benzonatate (TESSALON) 200 MG capsule Take 1 capsule (200 mg total) by mouth 2 (two) times daily as needed for cough. (Patient not taking: Reported  on 01/16/2022) 30 capsule 0   celecoxib (CELEBREX) 200 MG capsule Take 200 mg by mouth daily.     clotrimazole-betamethasone (LOTRISONE) cream Apply topically. (Patient not taking: Reported on 01/16/2022)     Misc Natural Products (OSTEO BI-FLEX/5-LOXIN ADVANCED PO) Take by mouth. (Patient not taking: Reported on 01/16/2022)     MOUNJARO 7.5 MG/0.5ML Pen Inject 7.5 mg into the skin.     OVER THE COUNTER MEDICATION Vitamin D 3, one capsule daily. (Patient not taking: Reported on 01/16/2022)     OVER THE COUNTER MEDICATION Zinc one capsule daily. (Patient not taking: Reported on 01/16/2022)     tadalafil (CIALIS) 5 MG tablet Take by mouth.     Current Facility-Administered Medications  Medication Dose Route Frequency Provider Last Rate Last Admin   0.9 %  sodium chloride infusion  500 mL Intravenous Once Dylyn Mclaren, Lajuan Lines, MD        Allergies as of 02/01/2022   (No Known Allergies)    Family History  Problem Relation Age of Onset   Dementia Mother    Stomach cancer Father 27   Colon cancer Neg Hx    Esophageal cancer Neg Hx    Rectal cancer Neg Hx     Social History   Socioeconomic History   Marital status: Married    Spouse name: Not on file   Number of children: Not on file   Years of education: Not on file   Highest education level: Not on file  Occupational History   Not on file  Tobacco Use   Smoking status: Former    Packs/day: 2.00    Years: 20.00    Total pack years: 40.00    Types: Cigarettes   Smokeless tobacco: Never  Vaping Use   Vaping Use: Former  Substance and Sexual Activity   Alcohol use: Not Currently    Alcohol/week: 4.0 standard drinks of alcohol    Types: 4 Cans of beer per week   Drug use: No   Sexual activity: Not on file  Other Topics Concern   Not on file  Social History Narrative   Not on file   Social Determinants of Health   Financial Resource Strain: Not on file  Food Insecurity: Not on file  Transportation Needs: Not on file   Physical Activity: Not on file  Stress: Not on file  Social Connections: Not on file  Intimate Partner Violence: Not on file    Physical Exam: Vital signs in last 24 hours: '@BP'$  121/61   Pulse 96   Temp 99.6 F (37.6 C) (Temporal)   Resp 16   Ht 6' (1.829 m)   Wt 225 lb (102.1 kg)   SpO2 99%   BMI 30.52 kg/m  GEN: NAD EYE: Sclerae anicteric ENT: MMM CV: Non-tachycardic Pulm: CTA b/l GI: Soft, NT/ND NEURO:  Alert & Oriented x 3   Zenovia Jarred, MD [facility]  02/01/2022 9:18 AM

## 2022-02-04 ENCOUNTER — Telehealth: Payer: Self-pay | Admitting: *Deleted

## 2022-02-04 NOTE — Telephone Encounter (Signed)
  Follow up Call-     02/01/2022    8:15 AM  Call back number  Post procedure Call Back phone  # [phone]  Permission to leave phone message Yes     Patient questions:  Do you have a fever, pain , or abdominal swelling? No. Pain Score  0 *  Have you tolerated food without any problems? Yes.    Have you been able to return to your normal activities? Yes.    Do you have any questions about your discharge instructions: Diet   No. Medications  No. Follow up visit  No.  Do you have questions or concerns about your Care? No.  Actions: * If pain score is 4 or above: No action needed, pain <4.

## 2022-02-06 ENCOUNTER — Encounter: Payer: Self-pay | Admitting: Internal Medicine

## 2022-12-06 ENCOUNTER — Encounter: Payer: Self-pay | Admitting: Emergency Medicine
